# Patient Record
Sex: Female | Born: 1980 | Race: Black or African American | Hispanic: No | Marital: Single | Smoking: Former smoker
Health system: Southern US, Community
[De-identification: ages and names within clinical notes are randomized; demographics above are authoritative.]

## PROBLEM LIST (undated history)

## (undated) DIAGNOSIS — T753XXA Motion sickness, initial encounter: Secondary | ICD-10-CM

## (undated) DIAGNOSIS — T8859XA Other complications of anesthesia, initial encounter: Secondary | ICD-10-CM

## (undated) DIAGNOSIS — Z973 Presence of spectacles and contact lenses: Secondary | ICD-10-CM

## (undated) DIAGNOSIS — D649 Anemia, unspecified: Secondary | ICD-10-CM

## (undated) DIAGNOSIS — R05 Cough: Secondary | ICD-10-CM

## (undated) DIAGNOSIS — T4145XA Adverse effect of unspecified anesthetic, initial encounter: Secondary | ICD-10-CM

## (undated) DIAGNOSIS — R569 Unspecified convulsions: Secondary | ICD-10-CM

## (undated) DIAGNOSIS — K219 Gastro-esophageal reflux disease without esophagitis: Secondary | ICD-10-CM

## (undated) HISTORY — PX: UTERINE FIBROID EMBOLIZATION: SHX825

## (undated) HISTORY — PX: DILATION AND CURETTAGE OF UTERUS: SHX78

## (undated) HISTORY — PX: NO PAST SURGERIES: SHX2092

---
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
## 2004-12-02 ENCOUNTER — Emergency Department: Payer: Self-pay | Admitting: Unknown Physician Specialty

## 2005-04-28 ENCOUNTER — Emergency Department: Payer: Self-pay | Admitting: General Practice

## 2007-02-22 ENCOUNTER — Emergency Department: Payer: Self-pay | Admitting: Emergency Medicine

## 2008-01-15 ENCOUNTER — Emergency Department: Payer: Self-pay | Admitting: Emergency Medicine

## 2008-03-22 ENCOUNTER — Emergency Department: Payer: Self-pay | Admitting: Emergency Medicine

## 2009-01-21 ENCOUNTER — Emergency Department: Payer: Self-pay | Admitting: Emergency Medicine

## 2009-07-18 ENCOUNTER — Emergency Department: Payer: Self-pay

## 2010-02-10 ENCOUNTER — Emergency Department: Payer: Self-pay | Admitting: Emergency Medicine

## 2014-01-28 ENCOUNTER — Inpatient Hospital Stay: Payer: Self-pay | Admitting: Obstetrics and Gynecology

## 2014-01-28 LAB — BASIC METABOLIC PANEL
ANION GAP: 7 (ref 7–16)
BUN: 9 mg/dL (ref 7–18)
CHLORIDE: 104 mmol/L (ref 98–107)
CO2: 26 mmol/L (ref 21–32)
Calcium, Total: 8.6 mg/dL (ref 8.5–10.1)
Creatinine: 1.01 mg/dL (ref 0.60–1.30)
EGFR (African American): >60
Glucose: 128 mg/dL — ABNORMAL HIGH (ref 65–99)
Osmolality: 274 (ref 275–301)
Potassium: 3.5 mmol/L (ref 3.5–5.1)
Sodium: 137 mmol/L (ref 136–145)

## 2014-01-28 LAB — CBC
HCT: 23.4 % — ABNORMAL LOW (ref 35.0–47.0)
HGB: 7.4 g/dL — AB (ref 12.0–16.0)
MCH: 26.7 pg (ref 26.0–34.0)
MCHC: 31.5 g/dL — AB (ref 32.0–36.0)
MCV: 85 fL (ref 80–100)
Platelet: 301 10*3/uL (ref 150–440)
RBC: 2.76 10*6/uL — AB (ref 3.80–5.20)
RDW: 13.7 % (ref 11.5–14.5)
WBC: 12.8 10*3/uL — AB (ref 3.6–11.0)

## 2014-01-28 LAB — HCG, QUANTITATIVE, PREGNANCY: Beta Hcg, Quant.: <1 m[IU]/mL — ABNORMAL LOW

## 2014-01-29 LAB — HEMOGLOBIN
HGB: 8.1 g/dL — AB (ref 12.0–16.0)
HGB: 9.2 g/dL — AB (ref 12.0–16.0)
HGB: 9.1 g/dL — AB (ref 12.0–16.0)

## 2014-01-30 LAB — HEMOGLOBIN
HGB: 8.2 g/dL — ABNORMAL LOW (ref 12.0–16.0)
HGB: 7.5 g/dL — ABNORMAL LOW (ref 12.0–16.0)

## 2014-01-30 LAB — GC/CHLAMYDIA PROBE AMP

## 2014-01-31 LAB — HEMOGLOBIN
HGB: 6.9 g/dL — ABNORMAL LOW (ref 12.0–16.0)
HGB: 6.7 g/dL — ABNORMAL LOW (ref 12.0–16.0)
HGB: 8.2 g/dL — AB (ref 12.0–16.0)

## 2014-02-02 LAB — PATHOLOGY REPORT

## 2015-02-08 NOTE — Op Note (Signed)
PATIENT NAME:  Kim Craig, [name] MR#:  [id number] DATE OF BIRTH:  Dec 15, 1980  DATE OF PROCEDURE:  01/30/2014  DATE OF DICTATION: 01/31/2014    PREOPERATIVE DIAGNOSES:  1. Menorrhagia.  2. Symptomatic anemia.   POSTOPERATIVE DIAGNOSES:  1. Menorrhagia.  2. Symptomatic anemia.   OPERATION: Dilation and curettage.   ANESTHESIA: General endotracheal.   SURGEON: Keeleigh Terris S. Valentino Saxonherry, MD  ESTIMATED BLOOD LOSS: 600 mL.   COMPLICATIONS: None.   FINDINGS: Included an enlarged boggy uterus, approximately 10-week size. Uterus was sounded to 10 cm. There were numerous large blood clots evacuated from the uterine cavity.   SPECIMEN: Endometrial curettings.   Indication: Patient is a 34 y.o P1-0-0-1 female with symptomatic anemia requiring blood transfusion, menorrhagia x several weeks.  Patient was admitted with a Hgbof 7.4, but was symptomatic with weakness, fatigue, and tachycardia at presentation.  Was started on Provera 20 mg and transfused 2 units PRBCs with inappropriate rise to 8.2.  Given an additional unit which increased Hgb to 9.2, however patient began to experience heavy vaginal bleeding again, withHgb decreasing back down to 8.4 in less than 24 hrs.  Decision was made to proceed with surgical management with Dilation and Curettage.   PROCEDURE: The patient was taken to the operating room, where she was placed under general anesthesia without difficulty. She was then placed in the dorsal lithotomy position and prepped and draped in the normal sterile fashion. Next, a straight catheterization was performed, with a return of 20 mL of clear urine.  After this, a weighted speculum was inserted into the patient's vagina. The cervix was visualized, and a single-tooth tenaculum was used to grasp the anterior lip of the cervix. The uterus was sounded to 10 cm. The cervix was already noted to be dilated. A endometrial curette was then passed into the uterine cavity, and the cavity was curettaged until a  gritty texture was noted. Numerous blood clots were evacuated from the uterus. Endometrial samplings were collected and sent to pathology. After the curettage, the tenaculum was removed from the anterior lip of the cervix. The weighted speculum was then removed from the patient's vagina. The patient tolerated the procedure well. She was then taken to the PACU in stable condition.    ____________________________ Jacques EarthlyAnika S. Valentino Saxonherry, MD asc:lb D: 01/31/2014 08:37:00 ET T: 01/31/2014 08:48:58 ET JOB#: [id number]  cc: Jacques EarthlyAnika S. Valentino Saxonherry, MD, <Dictator> Fabian NovemberANIKA S Randa Riss MD ELECTRONICALLY SIGNED 02/03/2014 17:45

## 2015-03-09 ENCOUNTER — Ambulatory Visit
Admission: EM | Admit: 2015-03-09 | Discharge: 2015-03-09 | Disposition: A | Discharge status: Home / Self Care | Payer: No Typology Code available for payment source | Attending: Family Medicine | Admitting: Family Medicine

## 2015-03-09 DIAGNOSIS — J029 Acute pharyngitis, unspecified: Secondary | ICD-10-CM | POA: Insufficient documentation

## 2015-03-09 LAB — RAPID STREP SCREEN (MED CTR MEBANE ONLY): Streptococcus, Group A Screen (Direct): NEGATIVE

## 2015-03-09 MED ORDER — AMOXICILLIN-POT CLAVULANATE 875-125 MG PO TABS: 1.0000 | ORAL_TABLET | Freq: Two times a day (BID) | ORAL | Status: AC

## 2015-03-09 NOTE — ED Provider Notes (Addendum)
SUBJECTIVE:  Kim Craig is a 34 y.o. female who complains of sore throat, fatigue, fever for the last 2 days. Denies CP, SOB, N/V/D, abdominal pain, severe headache. Has tried OTC Medication without much relief.    OBJECTIVE: She appears well, vital signs are as noted.  General: NAD HEENT: moderate pharyngeal erythema, no exudate, no erythema of TMs, mild cervical LAD Respiratory: CTA B Cardiology: tachycardic Abdomen: obese, +BS, NT/ND, no guarding or rebound Neurological: CN II-XII grossly intact   ASSESSMENT:  Pharyngitis  PLAN: Negative rapid strep.Given symptoms will treat with Augmentin, Ibuprofen prn, rest,  hydration, seek medical attention if symptoms persist or worsen as discussed. Work excuse for 1 day given to patient.       Jolene ProvostKirtida Tu Bayle, MD 03/09/15 1514  Jolene ProvostKirtida Tavaughn Silguero, MD 03/09/15 [id number]

## 2015-03-09 NOTE — ED Notes (Signed)
Started last night with c/o very sore throat. + chills. + fatigue

## 2015-03-11 LAB — CULTURE, GROUP A STREP (THRC)

## 2016-09-10 ENCOUNTER — Encounter: Payer: Self-pay | Admitting: Emergency Medicine

## 2016-09-10 ENCOUNTER — Emergency Department
Admission: EM | Admit: 2016-09-10 | Discharge: 2016-09-11 | Disposition: A | Discharge status: Home / Self Care | Payer: Managed Care, Other (non HMO) | Attending: Emergency Medicine | Admitting: Emergency Medicine

## 2016-09-10 DIAGNOSIS — Z3A01 Less than 8 weeks gestation of pregnancy: Secondary | ICD-10-CM | POA: Diagnosis not present

## 2016-09-10 DIAGNOSIS — O26891 Other specified pregnancy related conditions, first trimester: Secondary | ICD-10-CM | POA: Diagnosis not present

## 2016-09-10 DIAGNOSIS — O219 Vomiting of pregnancy, unspecified: Secondary | ICD-10-CM | POA: Diagnosis present

## 2016-09-10 DIAGNOSIS — R079 Chest pain, unspecified: Secondary | ICD-10-CM | POA: Insufficient documentation

## 2016-09-10 DIAGNOSIS — Z87891 Personal history of nicotine dependence: Secondary | ICD-10-CM | POA: Insufficient documentation

## 2016-09-10 LAB — COMPREHENSIVE METABOLIC PANEL
ALK PHOS: 61 U/L (ref 38–126)
ALT: 11 U/L — AB (ref 14–54)
ANION GAP: 7 (ref 5–15)
AST: 13 U/L — ABNORMAL LOW (ref 15–41)
Albumin: 3.7 g/dL (ref 3.5–5.0)
BILIRUBIN TOTAL: 0.1 mg/dL — AB (ref 0.3–1.2)
BUN: 10 mg/dL (ref 6–20)
CALCIUM: 9.8 mg/dL (ref 8.9–10.3)
CO2: 23 mmol/L (ref 22–32)
CREATININE: 0.73 mg/dL (ref 0.44–1.00)
Chloride: 104 mmol/L (ref 101–111)
GFR calc non Af Amer: >60 mL/min (ref >60)
GLUCOSE: 94 mg/dL (ref 65–99)
Potassium: 4.1 mmol/L (ref 3.5–5.1)
Sodium: 134 mmol/L — ABNORMAL LOW (ref 135–145)
TOTAL PROTEIN: 8 g/dL (ref 6.5–8.1)

## 2016-09-10 LAB — URINALYSIS COMPLETE WITH MICROSCOPIC (ARMC ONLY)
Bilirubin Urine: NEGATIVE
GLUCOSE, UA: NEGATIVE mg/dL
Hgb urine dipstick: NEGATIVE
KETONES UR: NEGATIVE mg/dL
NITRITE: NEGATIVE
PH: 7 (ref 5.0–8.0)
PROTEIN: NEGATIVE mg/dL
Specific Gravity, Urine: 1.014 (ref 1.005–1.030)

## 2016-09-10 LAB — HCG, QUANTITATIVE, PREGNANCY: HCG, BETA CHAIN, QUANT, S: 55022 m[IU]/mL — AB (ref <5)

## 2016-09-10 LAB — CBC
HCT: 29 % — ABNORMAL LOW (ref 35.0–47.0)
HEMOGLOBIN: 9.2 g/dL — AB (ref 12.0–16.0)
MCH: 19.8 pg — AB (ref 26.0–34.0)
MCHC: 31.7 g/dL — AB (ref 32.0–36.0)
MCV: 62.4 fL — ABNORMAL LOW (ref 80.0–100.0)
PLATELETS: 446 10*3/uL — AB (ref 150–440)
RBC: 4.64 MIL/uL (ref 3.80–5.20)
RDW: 20.7 % — ABNORMAL HIGH (ref 11.5–14.5)
WBC: 9.2 10*3/uL (ref 3.6–11.0)

## 2016-09-10 LAB — LIPASE, BLOOD: Lipase: 17 U/L (ref 11–51)

## 2016-09-10 MED ORDER — PYRIDOXINE HCL 25 MG PO TABS: 50.0000 mg | ORAL_TABLET | Freq: Three times a day (TID) | ORAL | 0 refills | Status: DC

## 2016-09-10 MED ORDER — SUCRALFATE 1 G PO TABS
1.0000 g | ORAL_TABLET | Freq: Once | ORAL | Status: AC
  Administered 2016-09-10: 1 g via ORAL
  Filled 2016-09-10: qty 1

## 2016-09-10 MED ORDER — METOCLOPRAMIDE HCL 5 MG/ML IJ SOLN
10.0000 mg | Freq: Once | INTRAMUSCULAR | Status: AC
  Administered 2016-09-10: 10 mg via INTRAVENOUS
  Filled 2016-09-10: qty 2

## 2016-09-10 MED ORDER — ONDANSETRON HCL 4 MG/2ML IJ SOLN
4.0000 mg | Freq: Once | INTRAMUSCULAR | Status: AC
  Administered 2016-09-10: 4 mg via INTRAVENOUS
  Filled 2016-09-10: qty 2

## 2016-09-10 MED ORDER — NITROFURANTOIN MONOHYD MACRO 100 MG PO CAPS: 100.0000 mg | ORAL_CAPSULE | Freq: Two times a day (BID) | ORAL | 0 refills | Status: AC

## 2016-09-10 MED ORDER — SODIUM CHLORIDE 0.9 % IV BOLUS (SEPSIS)
1000.0000 mL | Freq: Once | INTRAVENOUS | Status: AC
  Administered 2016-09-10: 1000 mL via INTRAVENOUS

## 2016-09-10 MED ORDER — ONDANSETRON HCL 4 MG PO TABS: 4.0000 mg | ORAL_TABLET | Freq: Three times a day (TID) | ORAL | 0 refills | Status: DC | PRN

## 2016-09-10 NOTE — Discharge Instructions (Signed)
As we discussed please start taking prenatal vitamins. Please seek medical attention for any high fevers, chest pain, shortness of breath, change in behavior, persistent vomiting, bloody stool or any other new or concerning symptoms.  

## 2016-09-10 NOTE — ED Triage Notes (Signed)
Pt states that she was told by her general practitioner that she was pregnant on Tuesday and for about a week she has been nauseated and vomiting. Pt is in NAD at this time.

## 2016-09-10 NOTE — ED Notes (Signed)
Patient given apple juice PO challenge, she is able to keep it down without nausea feeling worse.

## 2016-09-10 NOTE — ED Provider Notes (Signed)
[facility] Emergency Department Provider Note    ____________________________________________   I have reviewed the triage vital signs and the nursing notes.   HISTORY  Chief Complaint Nausea   History limited by: Not Limited   HPI Kim Craig is a 35 y.o. female who presents to the emergency department today because of concerns for nausea in the setting of an early pregnancy. Patient states that she was diagnosed with being pregnant this week when she was at her primary care doctor's office. They did prescribe Reglan. Patient has had continued nausea and vomiting. She states she is not able to hold anything down.   History reviewed. No pertinent past medical history.  There are no active problems to display for this patient.   History reviewed. No pertinent surgical history.  Prior to Admission medications   Not on File    Allergies Patient has no known allergies.  Family History  Problem Relation Age of Onset  . Diabetes Mother     Social History Social History  Substance Use Topics  . Smoking status: Former Games developermoker  . Smokeless tobacco: Never Used  . Alcohol use Yes     Comment: socially    Review of Systems  Constitutional: Negative for fever. Cardiovascular: Negative for chest pain. Respiratory: Negative for shortness of breath. Gastrointestinal: Positive for nausea and vomiting.  Genitourinary: Negative for dysuria. Musculoskeletal: Negative for back pain. Skin: Negative for rash. Neurological: Negative for headaches, focal weakness or numbness.   10-point ROS otherwise negative.  ____________________________________________   PHYSICAL EXAM:  VITAL SIGNS: ED Triage Vitals  Enc Vitals Group     BP 09/10/16 2119 105/89     Pulse Rate 09/10/16 2119 84     Resp 09/10/16 2119 20     Temp 09/10/16 2119 98.2 F (36.8 C)     Temp Source 09/10/16 2119 Oral     SpO2 09/10/16 2119 100 %     Weight 09/10/16 2120 209 lb  (94.8 kg)     Height 09/10/16 2120 5\' 4"  (1.626 m)     Head Circumference --      Peak Flow --      Pain Score 09/10/16 2120 0   Constitutional: Alert and oriented. Well appearing and in no distress. Eyes: Conjunctivae are normal. Normal extraocular movements. ENT   Head: Normocephalic and atraumatic.   Nose: No congestion/rhinnorhea.   Mouth/Throat: Mucous membranes are moist.   Neck: No stridor. Hematological/Lymphatic/Immunilogical: No cervical lymphadenopathy. Cardiovascular: Normal rate, regular rhythm.  No murmurs, rubs, or gallops.  Respiratory: Normal respiratory effort without tachypnea nor retractions. Breath sounds are clear and equal bilaterally. No wheezes/rales/rhonchi. Gastrointestinal: Soft and nontender. No distention.  Genitourinary: Deferred Musculoskeletal: Normal range of motion in all extremities. No lower extremity edema. Neurologic:  Normal speech and language. No gross focal neurologic deficits are appreciated.  Skin:  Skin is warm, dry and intact. No rash noted. Psychiatric: Mood and affect are normal. Speech and behavior are normal. Patient exhibits appropriate insight and judgment.  ____________________________________________    LABS (pertinent positives/negatives)  Labs Reviewed  COMPREHENSIVE METABOLIC PANEL - Abnormal; Notable for the following:       Result Value   Sodium 134 (*)    AST 13 (*)    ALT 11 (*)    Total Bilirubin 0.1 (*)    All other components within normal limits  CBC - Abnormal; Notable for the following:    Hemoglobin 9.2 (*)    HCT 29.0 (*)  MCV 62.4 (*)    MCH 19.8 (*)    MCHC 31.7 (*)    RDW 20.7 (*)    Platelets 446 (*)    All other components within normal limits  URINALYSIS COMPLETEWITH MICROSCOPIC ([facility] ONLY) - Abnormal; Notable for the following:    Color, Urine YELLOW (*)    APPearance CLOUDY (*)    Leukocytes, UA 3+ (*)    Bacteria, UA RARE (*)    Squamous Epithelial / LPF 6-30 (*)    All  other components within normal limits  HCG, QUANTITATIVE, PREGNANCY - Abnormal; Notable for the following:    hCG, Beta Chain, Quant, S 55,022 (*)    All other components within normal limits  LIPASE, BLOOD     ____________________________________________   EKG  None  ____________________________________________    RADIOLOGY  None  ____________________________________________   PROCEDURES  Procedures  ____________________________________________   INITIAL IMPRESSION / ASSESSMENT AND PLAN / ED COURSE  Pertinent labs & imaging results that were available during my care of the patient were reviewed by me and considered in my medical decision making (see chart for details).  Patient emergency department today with nausea and vomiting related to early pregnancy. Blood work without any concerning electrolyte abnormalities. Urinalysis does show some white blood cells however some squamous cells as well. However given early pregnancy will give prescription for Macrobid. Patient stated she felt better after fluids and Zofran. Was able to keep down a little bit of fluid. ____________________________________________   FINAL CLINICAL IMPRESSION(S) / ED DIAGNOSES  Final diagnoses:  Nausea and vomiting during pregnancy     Note: This dictation was prepared with Dragon dictation. Any transcriptional errors that result from this process are unintentional    Phineas SemenGraydon Shanin Szymanowski, MD 09/10/16 2324

## 2017-11-08 DIAGNOSIS — R059 Cough, unspecified: Secondary | ICD-10-CM

## 2017-11-08 HISTORY — DX: Cough, unspecified: R05.9

## 2017-11-09 ENCOUNTER — Encounter: Payer: Self-pay | Admitting: *Deleted

## 2017-11-09 ENCOUNTER — Other Ambulatory Visit: Payer: Self-pay

## 2017-11-15 ENCOUNTER — Ambulatory Visit: Payer: 59 | Admitting: Student in an Organized Health Care Education/Training Program

## 2017-11-15 ENCOUNTER — Ambulatory Visit
Admission: RE | Admit: 2017-11-15 | Discharge: 2017-11-15 | Disposition: A | Discharge status: Home / Self Care | Payer: 59 | Source: Ambulatory Visit | Attending: Orthopedic Surgery | Admitting: Orthopedic Surgery

## 2017-11-15 ENCOUNTER — Encounter
Admission: RE | Disposition: A | Discharge status: Home / Self Care | Payer: Self-pay | Source: Ambulatory Visit | Attending: Orthopedic Surgery

## 2017-11-15 DIAGNOSIS — Z6836 Body mass index (BMI) 36.0-36.9, adult: Secondary | ICD-10-CM | POA: Insufficient documentation

## 2017-11-15 DIAGNOSIS — M19012 Primary osteoarthritis, left shoulder: Secondary | ICD-10-CM | POA: Insufficient documentation

## 2017-11-15 HISTORY — PX: SHOULDER ARTHROSCOPY: SHX128

## 2017-11-15 HISTORY — DX: Cough: R05

## 2017-11-15 HISTORY — DX: Anemia, unspecified: D64.9

## 2017-11-15 HISTORY — DX: Unspecified convulsions: R56.9

## 2017-11-15 HISTORY — DX: Motion sickness, initial encounter: T75.3XXA

## 2017-11-15 HISTORY — DX: Presence of spectacles and contact lenses: Z97.3

## 2017-11-15 SURGERY — ARTHROSCOPY, SHOULDER
Anesthesia: General | Site: Shoulder | Laterality: Left | Wound class: Clean

## 2017-11-15 MED ORDER — ONDANSETRON HCL 4 MG/2ML IJ SOLN
INTRAMUSCULAR | Status: DC | PRN
  Administered 2017-11-15: 4 mg via INTRAVENOUS

## 2017-11-15 MED ORDER — ACETAMINOPHEN 325 MG PO TABS: 325.0000 mg | ORAL_TABLET | ORAL | Status: DC | PRN

## 2017-11-15 MED ORDER — OXYCODONE HCL 5 MG/5ML PO SOLN: 5.0000 mg | Freq: Once | ORAL | Status: DC | PRN

## 2017-11-15 MED ORDER — SCOPOLAMINE 1 MG/3DAYS TD PT72
1.0000 | MEDICATED_PATCH | Freq: Once | TRANSDERMAL | Status: DC
  Administered 2017-11-15: 1.5 mg via TRANSDERMAL

## 2017-11-15 MED ORDER — OXYCODONE HCL 5 MG PO TABS: 5.0000 mg | ORAL_TABLET | Freq: Once | ORAL | Status: DC | PRN

## 2017-11-15 MED ORDER — IBUPROFEN 800 MG PO TABS: 800.0000 mg | ORAL_TABLET | Freq: Three times a day (TID) | ORAL | 0 refills | Status: AC

## 2017-11-15 MED ORDER — OXYCODONE HCL 5 MG PO TABS: 5.0000 mg | ORAL_TABLET | ORAL | 0 refills | Status: AC | PRN

## 2017-11-15 MED ORDER — ACETAMINOPHEN 500 MG PO TABS: 1000.0000 mg | ORAL_TABLET | Freq: Three times a day (TID) | ORAL | 2 refills | Status: AC

## 2017-11-15 MED ORDER — LACTATED RINGERS IV SOLN
INTRAVENOUS | Status: DC
  Administered 2017-11-15 (×2): via INTRAVENOUS

## 2017-11-15 MED ORDER — LIDOCAINE-EPINEPHRINE 1 %-1:100000 IJ SOLN
INTRAMUSCULAR | Status: DC | PRN
  Administered 2017-11-15: 5 mL

## 2017-11-15 MED ORDER — LIDOCAINE HCL (CARDIAC) 20 MG/ML IV SOLN
INTRAVENOUS | Status: DC | PRN
  Administered 2017-11-15: 40 mg via INTRAVENOUS

## 2017-11-15 MED ORDER — FENTANYL CITRATE (PF) 100 MCG/2ML IJ SOLN
INTRAMUSCULAR | Status: DC | PRN
  Administered 2017-11-15: 100 ug via INTRAVENOUS

## 2017-11-15 MED ORDER — ACETAMINOPHEN 160 MG/5ML PO SOLN: 325.0000 mg | ORAL | Status: DC | PRN

## 2017-11-15 MED ORDER — FENTANYL CITRATE (PF) 100 MCG/2ML IJ SOLN: 25.0000 ug | INTRAMUSCULAR | Status: DC | PRN

## 2017-11-15 MED ORDER — ONDANSETRON 4 MG PO TBDP: 4.0000 mg | ORAL_TABLET | Freq: Three times a day (TID) | ORAL | 0 refills | Status: DC | PRN

## 2017-11-15 MED ORDER — ROPIVACAINE HCL 5 MG/ML IJ SOLN
INTRAMUSCULAR | Status: DC | PRN
  Administered 2017-11-15: 35 mL via PERINEURAL

## 2017-11-15 MED ORDER — ONDANSETRON HCL 4 MG/2ML IJ SOLN: 4.0000 mg | Freq: Once | INTRAMUSCULAR | Status: DC | PRN

## 2017-11-15 MED ORDER — DEXAMETHASONE SODIUM PHOSPHATE 4 MG/ML IJ SOLN
INTRAMUSCULAR | Status: DC | PRN
  Administered 2017-11-15: 4 mg via PERINEURAL

## 2017-11-15 MED ORDER — PROPOFOL 500 MG/50ML IV EMUL
INTRAVENOUS | Status: DC | PRN
  Administered 2017-11-15: 75 ug/kg/min via INTRAVENOUS

## 2017-11-15 MED ORDER — MIDAZOLAM HCL 2 MG/2ML IJ SOLN
INTRAMUSCULAR | Status: DC | PRN
  Administered 2017-11-15 (×2): 1 mg via INTRAVENOUS
  Administered 2017-11-15: 2 mg via INTRAVENOUS

## 2017-11-15 MED ORDER — DEXTROSE 5 % IV SOLN
2000.0000 mg | Freq: Once | INTRAVENOUS | Status: AC
  Administered 2017-11-15: 2000 mg via INTRAVENOUS

## 2017-11-15 SURGICAL SUPPLY — 42 items
BLADE MED AGGRESSIVE (BLADE) ×3 IMPLANT
BLADE OSCILLATING/SAGITTAL (BLADE) ×2
BLADE SW THK.38XMED LNG THN (BLADE) ×1 IMPLANT
CANISTER SUCT 1200ML W/VALVE (MISCELLANEOUS) ×3 IMPLANT
CHLORAPREP W/TINT 26ML (MISCELLANEOUS) ×3 IMPLANT
CLOSURE WOUND 1/2 X4 (GAUZE/BANDAGES/DRESSINGS) ×1
COVER LIGHT HANDLE UNIVERSAL (MISCELLANEOUS) ×6 IMPLANT
DECANTER SPIKE VIAL GLASS SM (MISCELLANEOUS) ×3 IMPLANT
DRAPE IMP U-DRAPE 54X76 (DRAPES) ×6 IMPLANT
DRAPE INCISE IOBAN 66X45 STRL (DRAPES) ×3 IMPLANT
DRAPE SHEET LG 3/4 BI-LAMINATE (DRAPES) ×3 IMPLANT
DRSG TEGADERM 4X4.75 (GAUZE/BANDAGES/DRESSINGS) ×3 IMPLANT
ELECT REM PT RETURN 9FT ADLT (ELECTROSURGICAL) ×3
ELECTRODE REM PT RTRN 9FT ADLT (ELECTROSURGICAL) ×1 IMPLANT
GAUZE PETRO XEROFOAM 1X8 (MISCELLANEOUS) ×3 IMPLANT
GAUZE SPONGE 4X4 12PLY STRL (GAUZE/BANDAGES/DRESSINGS) ×3 IMPLANT
GLOVE BIOGEL PI IND STRL 8 (GLOVE) ×1 IMPLANT
GLOVE BIOGEL PI INDICATOR 8 (GLOVE) ×2
GOWN STRL REUS W/ TWL LRG LVL3 (GOWN DISPOSABLE) ×1 IMPLANT
GOWN STRL REUS W/TWL LRG LVL3 (GOWN DISPOSABLE) ×2
GOWN STRL REUS W/TWL LRG LVL4 (GOWN DISPOSABLE) ×3 IMPLANT
KIT RM TURNOVER STRD PROC AR (KITS) ×3 IMPLANT
KIT STABILIZATION SHOULDER (MISCELLANEOUS) ×3 IMPLANT
MASK FACE SPIDER DISP (MASK) ×3 IMPLANT
NEEDLE HYPO 21X1.5 SAFETY (NEEDLE) ×3 IMPLANT
NS IRRIG 500ML POUR BTL (IV SOLUTION) ×6 IMPLANT
PACK ARTHROSCOPY SHOULDER (MISCELLANEOUS) ×3 IMPLANT
PAD ABD DERMACEA PRESS 5X9 (GAUZE/BANDAGES/DRESSINGS) ×3 IMPLANT
PAD WRAPON POLAR SHDR UNIV (MISCELLANEOUS) ×1 IMPLANT
SLING ARM LRG DEEP (SOFTGOODS) ×3 IMPLANT
SLING ULTRA II M (MISCELLANEOUS) ×3 IMPLANT
STAPLER SKIN PROX 35W (STAPLE) ×3 IMPLANT
STRAP BODY AND KNEE 60X3 (MISCELLANEOUS) ×3 IMPLANT
STRIP CLOSURE SKIN 1/2X4 (GAUZE/BANDAGES/DRESSINGS) ×2 IMPLANT
SUT MNCRL 4-0 (SUTURE) ×2
SUT MNCRL 4-0 27XMFL (SUTURE) ×1
SUT VIC AB 0 CT1 36 (SUTURE) ×3 IMPLANT
SUT VIC AB 3-0 SH 27 (SUTURE) ×2
SUT VIC AB 3-0 SH 27X BRD (SUTURE) ×1 IMPLANT
SUT VICRYL 0 AB UR-6 (SUTURE) ×3 IMPLANT
SUTURE MNCRL 4-0 27XMF (SUTURE) ×1 IMPLANT
WRAPON POLAR PAD SHDR UNIV (MISCELLANEOUS) ×3

## 2017-11-15 NOTE — Anesthesia Procedure Notes (Signed)
Performed by: Ethel Veronica, CRNA Pre-anesthesia Checklist: Patient identified, Emergency Drugs available, Suction available, Patient being monitored and Timeout performed Patient Re-evaluated:Patient Re-evaluated prior to induction Oxygen Delivery Method: Simple face mask Placement Confirmation: positive ETCO2 and breath sounds checked- equal and bilateral       

## 2017-11-15 NOTE — Anesthesia Procedure Notes (Signed)
Anesthesia Regional Block: Interscalene brachial plexus block   Pre-Anesthetic Checklist: ,, timeout performed, Correct Patient, Correct Site, Correct Laterality, Correct Procedure, Correct Position, site marked, Risks and benefits discussed,  Surgical consent,  Pre-op evaluation,  At surgeon's request and post-op pain management  Laterality: Left  Prep: chloraprep       Needles:  Injection technique: Single-shot  Needle Type: Stimiplex     Needle Length: 10cm  Needle Gauge: 21     Additional Needles:   Narrative:  Start time: 11/15/2017 12:28 PM End time: 11/15/2017 12:34 PM Injection made incrementally with aspirations every 5 mL.  Performed by: Personally  Anesthesiologist: Ranee GosselinStella, Dariana Garbett, MD  Additional Notes: Functioning IV was confirmed and monitors applied. Ultrasound guidance: relevant anatomy identified, needle position confirmed, local anesthetic spread visualized around nerve(s)., vascular puncture avoided.  Image printed for medical record.  Negative aspiration and no paresthesias; incremental administration of local anesthetic. The patient tolerated the procedure well. Vitals signes recorded in RN notes.

## 2017-11-15 NOTE — Op Note (Signed)
Operative Note   SURGERY DATE: 11/15/2017  PRE-OP DIAGNOSIS:  1. L acromioclavicular joint osteoarthritis  POST-OP DIAGNOSIS: 1. L acromioclavicular joint osteoarthritis  PROCEDURES:  1. L open distal clavicle excision  SURGEON: Rosealee AlbeeSunny H. Halah Whiteside, MD  ASSISTANTS: none  ANESTHESIA: Gen   ESTIMATED BLOOD LOSS:20cc  TOTAL IV FLUIDS: per anesthesia  DRAIN: none  INDICATION(S):  Kim Craig is a 37 y.o. female with L shoulder pain. Clinical examination and imaging was consistent with [facility] joint arthritis. Diagnostic lidocaine [facility] into [facility] provided >95% relief of pain.   OPERATIVE FINDINGS: significant acromioclavicular joint arthritic changes  OPERATIVE REPORT:  I identified Kim Craig the pre-operative holding area. Informed consent was obtained and the surgical site was marked. I reviewed the risks and benefits of the proposed surgical intervention and the patient (and/or patient's guardian) wished to proceed. The patient was transferred to the operative suite and general anesthesia was administered. The patient was placed in the beach chair position with the head of the bed elevated approximately 70 degrees. All down side pressure points were appropriately padded. The extremity was then prepped and draped in standard fashion. A time out was performed confirming the correct extremity, correct patient, and correct procedure.   The acromioclavicular joint was identified with a spinal needle. An ~5cm incision parallel to the skin lines was made, centered on the [facility] joint. Dissection was carried down to deltotrapezial fascia. This was incised sharply and dissection was carried down to the [facility] joint capsule. This was incised in line with the fibers from ~125mm lateral to the [facility] joint to ~4115mm medial to the [facility] joint. The capsule was elevated anteriorly and posteriorly in a subperiosteal fashion. A mark was made ~5710mm distal to and parallel to the [facility] joint. A needle was  placed back into the joint to estimate trajectory, which was relatively flat. A sagittal saw was used to make a cut ~410mm distal and parallel to the [facility] joint parallel to the direction of the joint as well. Distal clavicle was removed and hemostasis was achieved. I placed my finger into the space created and took the arm through a range of motion. There was no significant impingement. The wound was thoroughly irrigated. The capsule was then repaired meticulously with 0-Vicryl suture. The deltotrapezial fascia was also repaired with 0-Vicryl. The dermal layer was repaired with 3-0 Vicryl, and the skin with a 4-0 Monocryl in a running fashion. The wound was then covered with steri-strips, xeroform, and a sterile dressing.  A PolarCare and sling were placed. The patient was then transferred to a stretcher bed and to the post antesthesia care unit in stable condition.   POSTOPERATIVE PLAN: The patient will start PT on POD#3-4. She will remain in a sling for 4 weeks total. She can start pendulums and elbow/wrist/finger RoM immediately. Begin passive and active assisted shoulder RoM after 2 weeks. Can begin active shoulder RoM at 4 weeks. Follow up with me in ~2 weeks.

## 2017-11-15 NOTE — H&P (Signed)
Paper H&P to be scanned into permanent record. H&P reviewed. No significant changes noted.  

## 2017-11-15 NOTE — Anesthesia Preprocedure Evaluation (Signed)
Anesthesia Evaluation  Patient identified by MRN, date of birth, ID band Patient awake    Reviewed: Allergy & Precautions, H&P , NPO status , Patient's Chart, lab work & pertinent test results  Airway Mallampati: II  TM Distance: >3 FB Neck ROM: full    Dental no notable dental hx.    Pulmonary    Pulmonary exam normal breath sounds clear to auscultation       Cardiovascular Normal cardiovascular exam Rhythm:regular Rate:Normal     Neuro/Psych    GI/Hepatic   Endo/Other  Morbid obesity  Renal/GU      Musculoskeletal   Abdominal   Peds  Hematology   Anesthesia Other Findings   Reproductive/Obstetrics                             Anesthesia Physical Anesthesia Plan  ASA: II  Anesthesia Plan: General   Post-op Pain Management:  Regional for Post-op pain   Induction: Intravenous  PONV Risk Score and Plan: 3 and Ondansetron, Dexamethasone, Propofol infusion and Treatment may vary due to age or medical condition  Airway Management Planned: Natural Airway  Additional Equipment:   Intra-op Plan:   Post-operative Plan:   Informed Consent: I have reviewed the patients History and Physical, chart, labs and discussed the procedure including the risks, benefits and alternatives for the proposed anesthesia with the patient or authorized representative who has indicated his/her understanding and acceptance.     Plan Discussed with: CRNA  Anesthesia Plan Comments:         Anesthesia Quick Evaluation

## 2017-11-15 NOTE — Transfer of Care (Signed)
Immediate Anesthesia Transfer of Care Note  Patient: Kim Craig  Procedure(s) Performed: OPEN DISTAL CLAVICLE EXCISION (Left Shoulder)  Patient Location: PACU  Anesthesia Type: General  Level of Consciousness: awake, alert  and patient cooperative  Airway and Oxygen Therapy: Patient Spontanous Breathing and Patient connected to supplemental oxygen  Post-op Assessment: Post-op Vital signs reviewed, Patient's Cardiovascular Status Stable, Respiratory Function Stable, Patent Airway and No signs of Nausea or vomiting  Post-op Vital Signs: Reviewed and stable  Complications: No apparent anesthesia complications

## 2017-11-15 NOTE — Discharge Instructions (Addendum)
Post-Op Instructions - Shoulder Surgery  1. Bracing: You will wear a shoulder immobilizer or sling for 4 weeks.   2. Driving:  Ideally, we recommend no driving for 4 weeks while sling is in place as one arm will be immobilized.   3. Activity: No active lifting for 4-6 weeks. Wrist, hand, and elbow motion only with pendulum exercises for 1st 2 weeks after surgery. Weeks 2-4 can begin passive and active assist RoM exercises with physical therapist. Can have active motion after 4 weeks.   4. Physical Therapy: Begins 3-4 days after surgery, and proceed 1-2 times/week  5. Medications:  - You will be provided a prescription for narcotic pain medicine. After surgery, take 1-2 narcotic tablets every 4 hours if needed for severe pain.  - A prescription for anti-nausea medication will be provided in case the narcotic medicine causes nausea - take 1 tablet every 6 hours only if nauseated.   - Take tylenol 1000 mg (2 Extra Strength tablets or 3 regular strength) every 8 hours for pain.  May decrease or stop tylenol 5 days after surgery if you are having minimal pain. - Take ibuprofen (Advil/Motrin) 800mg  three times/day with food. Stop taking if you experience any GI irritation.    If you are taking prescription medication for anxiety, depression, insomnia, muscle spasm, chronic pain, or for attention deficit disorder, you are advised that you are at a higher risk of adverse effects with use of narcotics post-op, including narcotic addiction/dependence, depressed breathing, death. If you use non-prescribed substances: alcohol, marijuana, cocaine, heroin, methamphetamines, etc., you are at a higher risk of adverse effects with use of narcotics post-op, including narcotic addiction/dependence, depressed breathing, death. You are advised that taking > 50 morphine milligram equivalents (MME) of narcotic pain medication per day results in twice the risk of overdose or death. For your prescription provided:  oxycodone 5 mg - taking more than 6 tablets per day would result in > 50 morphine milligram equivalents (MME) of narcotic pain medication. Be advised that we will prescribe narcotics short-term, for acute post-operative pain only - 3 weeks for major operations such as shoulder repair/reconstruction surgeries.    6. Post-Op Appointment:  Your first post-op appointment will be 10-14 days post-op.  7. Work or School: For most, but not all procedures, we advise staying out of work or school for at least 1 to 2 weeks in order to recover from the stress of surgery and to allow time for healing.   If you need a work or school note this can be provided.        General Anesthesia, Adult, Care After These instructions provide you with information about caring for yourself after your procedure. Your health care provider may also give you more specific instructions. Your treatment has been planned according to current medical practices, but problems sometimes occur. Call your health care provider if you have any problems or questions after your procedure. What can I expect after the procedure? After the procedure, it is common to have:  Vomiting.  A sore throat.  Mental slowness.  It is common to feel:  Nauseous.  Cold or shivery.  Sleepy.  Tired.  Sore or achy, even in parts of your body where you did not have surgery.  Follow these instructions at home: For at least 24 hours after the procedure:  Do not: ? Participate in activities where you could fall or become injured. ? Drive. ? Use heavy machinery. ? Drink alcohol. ? Take sleeping pills  or medicines that cause drowsiness. ? Make important decisions or sign legal documents. ? Take care of children on your own.  Rest. Eating and drinking  If you vomit, drink water, juice, or soup when you can drink without vomiting.  Drink enough fluid to keep your urine clear or pale yellow.  Make sure you have little or no nausea  before eating solid foods.  Follow the diet recommended by your health care provider. General instructions  Have a responsible adult stay with you until you are awake and alert.  Return to your normal activities as told by your health care provider. Ask your health care provider what activities are safe for you.  Take over-the-counter and prescription medicines only as told by your health care provider.  If you smoke, do not smoke without supervision.  Keep all follow-up visits as told by your health care provider. This is important. Contact a health care provider if:  You continue to have nausea or vomiting at home, and medicines are not helpful.  You cannot drink fluids or start eating again.  You cannot urinate after 8-12 hours.  You develop a skin rash.  You have fever.  You have increasing redness at the site of your procedure. Get help right away if:  You have difficulty breathing.  You have chest pain.  You have unexpected bleeding.  You feel that you are having a life-threatening or urgent problem. This information is not intended to replace advice given to you by your health care provider. Make sure you discuss any questions you have with your health care provider. Document Released: 01/10/2001 Document Revised: 03/08/2016 Document Reviewed: 09/18/2015 Elsevier Interactive Patient Education  [employer]2018 [employer]

## 2017-11-15 NOTE — Progress Notes (Signed)
Assisted Mike Stella ANMD with left, ultrasound guided, interscalene block. Side rails up, monitors on throughout procedure. See vital signs in flow sheet. Tolerated Procedure well.   

## 2017-11-15 NOTE — Anesthesia Postprocedure Evaluation (Signed)
Anesthesia Post Note  Patient: Kim Craig  Procedure(s) Performed: OPEN DISTAL CLAVICLE EXCISION (Left Shoulder)  Patient location during evaluation: PACU Anesthesia Type: General Level of consciousness: awake and alert and oriented Pain management: satisfactory to patient Vital Signs Assessment: post-procedure vital signs reviewed and stable Respiratory status: spontaneous breathing, nonlabored ventilation and respiratory function stable Cardiovascular status: blood pressure returned to baseline and stable Postop Assessment: Adequate PO intake and No signs of nausea or vomiting Anesthetic complications: no    Cherly BeachStella, Ginni Eichler J

## 2017-11-16 ENCOUNTER — Encounter: Payer: Self-pay | Admitting: Orthopedic Surgery

## 2017-11-17 LAB — SURGICAL PATHOLOGY

## 2018-05-23 ENCOUNTER — Encounter: Payer: Self-pay | Admitting: Emergency Medicine

## 2018-05-23 ENCOUNTER — Other Ambulatory Visit: Payer: Self-pay

## 2018-05-23 ENCOUNTER — Emergency Department
Admission: EM | Admit: 2018-05-23 | Discharge: 2018-05-23 | Disposition: A | Discharge status: Home / Self Care | Payer: Managed Care, Other (non HMO) | Attending: Emergency Medicine | Admitting: Emergency Medicine

## 2018-05-23 DIAGNOSIS — N939 Abnormal uterine and vaginal bleeding, unspecified: Secondary | ICD-10-CM | POA: Diagnosis present

## 2018-05-23 DIAGNOSIS — Z79899 Other long term (current) drug therapy: Secondary | ICD-10-CM | POA: Diagnosis not present

## 2018-05-23 LAB — COMPREHENSIVE METABOLIC PANEL
ALBUMIN: 3.3 g/dL — AB (ref 3.5–5.0)
ALT: 13 U/L (ref 0–44)
ANION GAP: 6 (ref 5–15)
AST: 19 U/L (ref 15–41)
Alkaline Phosphatase: 67 U/L (ref 38–126)
BUN: 11 mg/dL (ref 6–20)
CHLORIDE: 107 mmol/L (ref 98–111)
CO2: 25 mmol/L (ref 22–32)
CREATININE: 0.98 mg/dL (ref 0.44–1.00)
Calcium: 9 mg/dL (ref 8.9–10.3)
GFR calc Af Amer: >60 mL/min (ref >60)
GFR calc non Af Amer: >60 mL/min (ref >60)
GLUCOSE: 120 mg/dL — AB (ref 70–99)
POTASSIUM: 3.8 mmol/L (ref 3.5–5.1)
SODIUM: 138 mmol/L (ref 135–145)
Total Bilirubin: 0.4 mg/dL (ref 0.3–1.2)
Total Protein: 6.8 g/dL (ref 6.5–8.1)

## 2018-05-23 LAB — CBC
HEMATOCRIT: 30.4 % — AB (ref 35.0–47.0)
Hemoglobin: 10.1 g/dL — ABNORMAL LOW (ref 12.0–16.0)
MCH: 28 pg (ref 26.0–34.0)
MCHC: 33.4 g/dL (ref 32.0–36.0)
MCV: 83.8 fL (ref 80.0–100.0)
PLATELETS: 289 10*3/uL (ref 150–440)
RBC: 3.62 MIL/uL — ABNORMAL LOW (ref 3.80–5.20)
RDW: 14.4 % (ref 11.5–14.5)
WBC: 6.6 10*3/uL (ref 3.6–11.0)

## 2018-05-23 NOTE — ED Notes (Signed)
Type and screen sent to the lab

## 2018-05-23 NOTE — ED Provider Notes (Signed)
[facility] Emergency Department Provider Note   ____________________________________________    I have reviewed the triage vital signs and the nursing notes.   HISTORY  Chief Complaint Vaginal Bleeding and Dizziness     HPI Kim Craig is a 37 y.o. female who presents with complaints of heavy vaginal bleeding.  Patient reports this is her first menstrual cycle since having a hysteroscopic myomectomy performed 1 month ago.  Prior to that she had been having daily vaginal bleeding, has been on multiple medications for this and has worked with her gynecologist.  Once in the past she is even required transfusion.  After procedure 1 month ago she has had nearly a month of no vaginal bleeding however she has had heavy vaginal bleeding with clots that started 4 days ago.  This is made her concerned that she had lost a much blood and came to the emergency department  Past Medical History:  Diagnosis Date  . Anemia   . Cough 11/08/2017  . Motion sickness    cars, boats, planes  . Seizures ([place])    x1 - over 15 yrs ago, after miscarriage  . Wears contact lenses     There are no active problems to display for this patient.   Past Surgical History:  Procedure Laterality Date  . NO PAST SURGERIES    . SHOULDER ARTHROSCOPY Left 11/15/2017   Procedure: OPEN DISTAL CLAVICLE EXCISION;  Surgeon: Leim Fabry, MD;  Location: [place];  Service: Orthopedics;  Laterality: Left;  MAC/ REG WITH INTERSCALENE BLOCK BEACH CHAIR WITH SPYDER SIMPLY SLING  . UTERINE FIBROID EMBOLIZATION      Prior to Admission medications   Medication Sig Start Date End Date Taking? Authorizing Provider  etonogestrel (NEXPLANON) 68 MG IMPL implant 1 each by Subdermal route once.   Yes [provider]  acetaminophen (TYLENOL) 500 MG tablet Take 2 tablets (1,000 mg total) by mouth every 8 (eight) hours. 11/15/17 11/15/18  Leim Fabry, MD  ipratropium (ATROVENT HFA)  17 MCG/ACT inhaler Inhale 2 puffs into the lungs 3 (three) times daily as needed for wheezing.    [provider]  ondansetron (ZOFRAN ODT) 4 MG disintegrating tablet Take 1 tablet (4 mg total) by mouth every 8 (eight) hours as needed for nausea or vomiting. Patient not taking: Reported on 05/23/2018 11/15/17   Leim Fabry, MD  oxyCODONE (ROXICODONE) 5 MG immediate release tablet Take 1-2 tablets (5-10 mg total) by mouth every 4 (four) hours as needed (pain). Patient not taking: Reported on 05/23/2018 11/15/17 11/15/18  Leim Fabry, MD     Allergies Metronidazole  Family History  Problem Relation Age of Onset  . Diabetes Mother     Social History Social History   Tobacco Use  . Smoking status: Never Smoker  . Smokeless tobacco: Never Used  Substance Use Topics  . Alcohol use: Yes    Alcohol/week: 1.2 oz    Types: 2 Glasses of wine per week    Comment: socially  . Drug use: No    Review of Systems  Constitutional: Occasional dizziness Eyes: No visual changes.  ENT: No sore throat. Cardiovascular: Denies chest pain. Respiratory: Denies shortness of breath. Gastrointestinal: Mild cramping Genitourinary: As above Musculoskeletal: Negative for back pain. Skin: Negative for rash. Neurological: Negative for headaches   ____________________________________________   PHYSICAL EXAM:  VITAL SIGNS: ED Triage Vitals  Enc Vitals Group     BP 05/23/18 1116 131/82     Pulse Rate  05/23/18 1116 91     Resp 05/23/18 1116 20     Temp 05/23/18 1116 97.7 F (36.5 C)     Temp Source 05/23/18 1116 Oral     SpO2 05/23/18 1116 100 %     Weight 05/23/18 1117 104.3 kg (230 lb)     Height 05/23/18 1117 1.626 m (_0 )     Head Circumference --      Peak Flow --      Pain Score 05/23/18 1119 6     Pain Loc --      Pain Edu? --      Excl. in [place]? --     Constitutional: Alert and oriented. No acute distress. Pleasant and interactive Eyes: Conjunctivae are normal.   Nose: No  congestion/rhinnorhea. Mouth/Throat: Mucous membranes are moist.    Cardiovascular: Normal rate, regular rhythm.Good peripheral circulation. Respiratory: Normal respiratory effort.  No retractions.   Genitourinary: deferred at patient request musculoskeletal:Warm and well perfused Neurologic:  Normal speech and language. No gross focal neurologic deficits are appreciated.  Skin:  Skin is warm, dry and intact. No rash noted. Psychiatric: Mood and affect are normal. Speech and behavior are normal.  ____________________________________________   LABS (all labs ordered are listed, but only abnormal results are displayed)  Labs Reviewed  COMPREHENSIVE METABOLIC PANEL - Abnormal; Notable for the following components:      Result Value   Glucose, Bld 120 (*)    Albumin 3.3 (*)    All other components within normal limits  CBC - Abnormal; Notable for the following components:   RBC 3.62 (*)    Hemoglobin 10.1 (*)    HCT 30.4 (*)    All other components within normal limits  POC URINE PREG, ED   ____________________________________________  EKG  None ____________________________________________  RADIOLOGY  None ____________________________________________   PROCEDURES  Procedure(s) performed: No  Procedures   Critical Care performed: No ____________________________________________   INITIAL IMPRESSION / ASSESSMENT AND PLAN / ED COURSE  Pertinent labs & imaging results that were available during my care of the patient were reviewed by me and considered in my medical decision making (see chart for details).  Patient presents with complaints of vaginal bleeding.  Vital signs are unremarkable.  She is well-appearing and sitting up in bed.  Hemoglobin is quite reassuring.  No indication for transfusion or further work-up in the emergency department, will notify patient's gynecology    ____________________________________________   FINAL CLINICAL IMPRESSION(S) / ED  DIAGNOSES  Final diagnoses:  None        Note:  This document was prepared using Dragon voice recognition software and may include unintentional dictation errors.    Lavonia Drafts, MD 05/23/18 1255

## 2018-05-23 NOTE — ED Triage Notes (Signed)
Says she has excessive vaginal bleeding.  Had some fibroids removed last month and this is first period since then.  Says history of anemia as well.

## 2018-05-23 NOTE — ED Notes (Signed)
.   Pt is resting, Respirations even and unlabored, NAD. Stretcher lowest postion and locked. Call bell within reach. Denies any needs at this time RN will continue to monitor.    

## 2018-05-29 ENCOUNTER — Emergency Department
Admission: EM | Admit: 2018-05-29 | Discharge: 2018-05-29 | Disposition: A | Discharge status: Home / Self Care | Payer: Managed Care, Other (non HMO) | Attending: Emergency Medicine | Admitting: Emergency Medicine

## 2018-05-29 ENCOUNTER — Other Ambulatory Visit: Payer: Self-pay

## 2018-05-29 ENCOUNTER — Encounter: Payer: Self-pay | Admitting: Emergency Medicine

## 2018-05-29 ENCOUNTER — Emergency Department: Payer: Managed Care, Other (non HMO)

## 2018-05-29 DIAGNOSIS — D649 Anemia, unspecified: Secondary | ICD-10-CM

## 2018-05-29 DIAGNOSIS — R0602 Shortness of breath: Secondary | ICD-10-CM

## 2018-05-29 DIAGNOSIS — Z79899 Other long term (current) drug therapy: Secondary | ICD-10-CM | POA: Insufficient documentation

## 2018-05-29 LAB — COMPREHENSIVE METABOLIC PANEL
ALK PHOS: 65 U/L (ref 38–126)
ALT: 19 U/L (ref 0–44)
ANION GAP: 5 (ref 5–15)
AST: 16 U/L (ref 15–41)
Albumin: 3.3 g/dL — ABNORMAL LOW (ref 3.5–5.0)
BUN: 12 mg/dL (ref 6–20)
CALCIUM: 8.7 mg/dL — AB (ref 8.9–10.3)
CO2: 25 mmol/L (ref 22–32)
Chloride: 110 mmol/L (ref 98–111)
Creatinine, Ser: 0.76 mg/dL (ref 0.44–1.00)
GFR calc non Af Amer: >60 mL/min (ref >60)
Glucose, Bld: 98 mg/dL (ref 70–99)
POTASSIUM: 4 mmol/L (ref 3.5–5.1)
SODIUM: 140 mmol/L (ref 135–145)
Total Bilirubin: 0.4 mg/dL (ref 0.3–1.2)
Total Protein: 6.8 g/dL (ref 6.5–8.1)

## 2018-05-29 LAB — CBC
HCT: 29.2 % — ABNORMAL LOW (ref 35.0–47.0)
Hemoglobin: 9.8 g/dL — ABNORMAL LOW (ref 12.0–16.0)
MCH: 28.2 pg (ref 26.0–34.0)
MCHC: 33.6 g/dL (ref 32.0–36.0)
MCV: 83.9 fL (ref 80.0–100.0)
PLATELETS: 352 10*3/uL (ref 150–440)
RBC: 3.48 MIL/uL — ABNORMAL LOW (ref 3.80–5.20)
RDW: 14.4 % (ref 11.5–14.5)
WBC: 7.6 10*3/uL (ref 3.6–11.0)

## 2018-05-29 LAB — BRAIN NATRIURETIC PEPTIDE: B NATRIURETIC PEPTIDE 5: 11 pg/mL (ref 0.0–100.0)

## 2018-05-29 LAB — TROPONIN I: Troponin I: <0.03 ng/mL (ref <0.03)

## 2018-05-29 MED ORDER — FERROUS SULFATE 325 (65 FE) MG PO TABS: 325.0000 mg | ORAL_TABLET | Freq: Every day | ORAL | 3 refills | Status: DC

## 2018-05-29 NOTE — ED Provider Notes (Signed)
[facility] Emergency Department Provider Note   ____________________________________________    I have reviewed the triage vital signs and the nursing notes.   HISTORY  Chief Complaint Shortness of breath    HPI Kim Craig is a 37 y.o. female who presents with complaints of shortness of breath.  Patient reports when she walks up her stairs she feels quite winded with some mild chest tightness.  She states that she knows that she has gained some weight but this is unusual for her.  Seen by me recently for heavy vaginal bleeding.  She reports that resolved and she has not had any further vaginal bleeding.  Denies fevers or chills.  No recent travel.  No calf pain or swelling.  No nausea or vomiting.  Feels well while resting.  No pleurisy.   Past Medical History:  Diagnosis Date  . Anemia   . Cough 11/08/2017  . Motion sickness    cars, boats, planes  . Seizures ([place])    x1 - over 15 yrs ago, after miscarriage  . Wears contact lenses     There are no active problems to display for this patient.   Past Surgical History:  Procedure Laterality Date  . NO PAST SURGERIES    . SHOULDER ARTHROSCOPY Left 11/15/2017   Procedure: OPEN DISTAL CLAVICLE EXCISION;  Surgeon: Leim Fabry, MD;  Location: [place];  Service: Orthopedics;  Laterality: Left;  MAC/ REG WITH INTERSCALENE BLOCK BEACH CHAIR WITH SPYDER SIMPLY SLING  . UTERINE FIBROID EMBOLIZATION      Prior to Admission medications   Medication Sig Start Date End Date Taking? Authorizing Provider  acetaminophen (TYLENOL) 500 MG tablet Take 2 tablets (1,000 mg total) by mouth every 8 (eight) hours. 11/15/17 11/15/18 Yes Leim Fabry, MD  etonogestrel (NEXPLANON) 68 MG IMPL implant 1 each by Subdermal route once.   Yes [provider]  ipratropium (ATROVENT HFA) 17 MCG/ACT inhaler Inhale 2 puffs into the lungs 3 (three) times daily as needed for wheezing.   Yes [provider]  ferrous sulfate 325 (65 FE) MG tablet Take 1 tablet (325 mg total) by mouth daily. 05/29/18 05/29/19  Lavonia Drafts, MD  ondansetron (ZOFRAN ODT) 4 MG disintegrating tablet Take 1 tablet (4 mg total) by mouth every 8 (eight) hours as needed for nausea or vomiting. Patient not taking: Reported on 05/23/2018 11/15/17   Leim Fabry, MD  oxyCODONE (ROXICODONE) 5 MG immediate release tablet Take 1-2 tablets (5-10 mg total) by mouth every 4 (four) hours as needed (pain). Patient not taking: Reported on 05/23/2018 11/15/17 11/15/18  Leim Fabry, MD     Allergies Metronidazole  Family History  Problem Relation Age of Onset  . Diabetes Mother     Social History Social History   Tobacco Use  . Smoking status: Never Smoker  . Smokeless tobacco: Never Used  Substance Use Topics  . Alcohol use: Yes    Alcohol/week: 2.0 standard drinks    Types: 2 Glasses of wine per week    Comment: socially  . Drug use: No    Review of Systems  Constitutional: No fever/chills Eyes: No visual changes.  ENT: No sore throat. Cardiovascular: As above Respiratory: As above Gastrointestinal: No abdominal pain.  No nausea, no vomiting.   Genitourinary: Negative for dysuria. Musculoskeletal: Negative for back pain. Skin: Negative for rash. Neurological: Negative for headaches   ____________________________________________   PHYSICAL EXAM:  VITAL SIGNS: ED Triage Vitals  Enc Vitals  Group     BP 05/29/18 1001 118/66     Pulse Rate 05/29/18 1001 71     Resp 05/29/18 1001 20     Temp 05/29/18 1001 97.9 F (36.6 C)     Temp Source 05/29/18 1001 Oral     SpO2 05/29/18 1001 99 %     Weight 05/29/18 1002 104.3 kg (230 lb)     Height 05/29/18 1002 1.626 m (_0 )     Head Circumference --      Peak Flow --      Pain Score 05/29/18 1012 4     Pain Loc --      Pain Edu? --      Excl. in [place]? --     Constitutional: Alert and oriented. No acute distress. Pleasant and interactive Eyes:  Conjunctivae are normal.   Nose: No congestion/rhinnorhea. Mouth/Throat: Mucous membranes are moist.    Cardiovascular: Normal rate, regular rhythm. Grossly normal heart sounds.  Good peripheral circulation. Respiratory: Normal respiratory effort.  No retractions. Lungs CTAB. Gastrointestinal: Soft and nontender. No distention.  No CVA tenderness. Genitourinary: deferred Musculoskeletal: No lower extremity tenderness nor edema.  Warm and well perfused Neurologic:  Normal speech and language. No gross focal neurologic deficits are appreciated.  Skin:  Skin is warm, dry and intact. No rash noted. Psychiatric: Mood and affect are normal. Speech and behavior are normal.  ____________________________________________   LABS (all labs ordered are listed, but only abnormal results are displayed)  Labs Reviewed  CBC - Abnormal; Notable for the following components:      Result Value   RBC 3.48 (*)    Hemoglobin 9.8 (*)    HCT 29.2 (*)    All other components within normal limits  COMPREHENSIVE METABOLIC PANEL - Abnormal; Notable for the following components:   Calcium 8.7 (*)    Albumin 3.3 (*)    All other components within normal limits  TROPONIN I  BRAIN NATRIURETIC PEPTIDE   ____________________________________________  EKG  ED ECG REPORT I, Lavonia Drafts, the attending physician, personally viewed and interpreted this ECG.  Date: 05/29/2018  Rhythm: normal sinus rhythm QRS Axis: normal Intervals: normal ST/T Wave abnormalities: normal Narrative Interpretation: no evidence of acute ischemia  ____________________________________________  RADIOLOGY  X-ray normal ____________________________________________   PROCEDURES  Procedure(s) performed: No  Procedures   Critical Care performed: No ____________________________________________   INITIAL IMPRESSION / ASSESSMENT AND PLAN / ED COURSE  Pertinent labs & imaging results that were available during my care  of the patient were reviewed by me and considered in my medical decision making (see chart for details).  Patient well-appearing in no acute distress, no wheezing on exam, lungs are clear to auscultation bilaterally.  Vitals are normal currently.  Denies pleurisy, no tachycardia.  Patient does admit that symptoms have been ongoing for many months and gradually getting worse, I wonder if this may be related to iron deficiency anemia  Work appears essentially unremarkable.  I will start her on iron supplementation have her follow-up closely with PCP for further work-up    ____________________________________________   FINAL CLINICAL IMPRESSION(S) / ED DIAGNOSES  Final diagnoses:  Shortness of breath  Anemia, unspecified type        Note:  This document was prepared using Dragon voice recognition software and may include unintentional dictation errors.     Lavonia Drafts, MD 05/29/18 [phone]

## 2018-05-29 NOTE — ED Notes (Signed)
First Nurse Note:  Hx of cough and HA X 3 days.  Denies any other symptoms.  Alert and oriented, NAD.

## 2018-05-29 NOTE — ED Triage Notes (Signed)
Pt states shob and headache with some dizziness for the past few days now, not relieved with advil.  Very short of breath when walking up steps. Appears in no distress at this time, talking in full sentences.

## 2019-04-02 ENCOUNTER — Ambulatory Visit: Admit: 2019-04-02 | Payer: Managed Care, Other (non HMO) | Admitting: Obstetrics and Gynecology

## 2019-04-02 SURGERY — HYSTERECTOMY, VAGINAL, LAPAROSCOPY-ASSISTED, WITH SALPINGO-OOPHORECTOMY
Anesthesia: General | Laterality: Left

## 2019-04-04 NOTE — H&P (Signed)
[name] is a 38 y.o. female here  For LAVH and LSO for menorrhagia and solid hyperechoic mass left ovary  . . . Pt was seen last year and had a endometrial mass. She underwent a Myosure resection by another provider in July 2019 and since bleeding has not improved . She bleeds 10-12 days per month and has to wear Pampers to control bleeding . She has failed Mirenax2 , lysteda and nexplanon  . She has pressure dyspareunia. Pap last year and all otheres normal . She is s/p 1 SVD . She desires definitive surgery . Of note she has a 1.7 cm solid component left ovary that was seen last yr .  U/s 12/2018:  Fibroid post to endometrium= 55 mm  Endometrium=6.53 mm  Rt ov wnl  Lt ov Solid mass seen sup avg= 1.79 cm 1.66 x 2.04 x 1.67 cm    Past Medical History:  has a past medical history of Abnormal uterine bleeding, Anxiety (08/13/2014), Encounter for blood transfusion (2015), GERD (gastroesophageal reflux disease), History of motion sickness, Morbid obesity (CMS-HCC), Plantar fasciitis (02/14/2012), and PONV (postoperative nausea and vomiting), unspecified.  Past Surgical History:  has a past surgical history that includes ; Combined hysteroscopy diagnostic / D&C (2015); Arthroscopy Shoulder; removal non-biodegradable drug delivery implant (Left, 04/18/2018); and Hysteroscopy with myosure (04/2018). Family History: family history includes Breast cancer in an other family member; Colon cancer in her maternal uncle; Diabetes type II in her mother; High blood pressure (Hypertension) in her mother; Hip fracture in her maternal grandmother; Obesity in her maternal grandmother and mother; Osteoporosis (Thinning of bones) in her maternal grandmother; Thyroid disease in her maternal grandmother. Social History:  reports that she has never smoked. She has never used smokeless tobacco. She reports that she does not drink alcohol or use drugs. OB/GYN History:          OB History    Gravida  5   Para  1   Term      Preterm      AB  4   Living  1     SAB  1   TAB  3   Ectopic      Molar      Multiple      Live Births  1          Allergies: is allergic to metronidazole. Medications:  Current Outpatient Medications:  .  acyclovir (ZOVIRAX) 5 % ointment, Apply 1 Application topically 5 (five) times daily, Disp: 15 g, Rfl: 0 .  valACYclovir (VALTREX) 500 MG tablet, Take 1 tablet (500 mg total) by mouth once daily, Disp: 30 tablet, Rfl: 11 .  metroNIDAZOLE (METROGEL) 0.75 % vaginal gel, Place 1 applicator vaginally nightly, Disp: 70 g, Rfl: 0  Review of Systems: General:                      No fatigue or weight loss Eyes:                           No vision changes Ears:                            No hearing difficulty Respiratory:                No cough or shortness of breath Pulmonary:  No asthma or shortness of breath Cardiovascular:           No chest pain, palpitations, dyspnea on exertion Gastrointestinal:          No abdominal bloating, chronic diarrhea, constipations, masses, pain or hematochezia Genitourinary:             No hematuria, dysuria, abnormal vaginal discharge, pelvic pain, +Menometrorrhagia Lymphatic:                   No swollen lymph nodes Musculoskeletal:         No muscle weakness Neurologic:                  No extremity weakness, syncope, seizure disorder Psychiatric:                  No history of depression, delusions or suicidal/homicidal ideation    Exam:      Vitals:   04/05/19 1051  BP: 108/65  Pulse: 64    Body mass index is 39.65 kg/m.  WDWN / black female in NAD   Lungs: CTA  CV : RRR without murmur    Neck:  no thyromegaly Abdomen: soft , no mass, normal active bowel sounds,  non-tender, no rebound tenderness Pelvic: tanner stage 5 ,  External genitalia: vulva /labia no lesions Urethra: no prolapse Vagina: wet mount : + bv , adequate room for LAVH if need be  Cervix: no  lesions, no cervical motion tenderness   Uterus: normal size shape and contour, non-tender Adnexa: no mass,  non-tender   Rectovaginal:       Impression:   The primary encounter diagnosis was Leukorrhea. Diagnoses of Ovarian cyst, left and Menorrhagia with irregular cycle were also pertinent to this visit.    Plan:  I have spoken with the patient regarding treatment options including expectant management, hormonal options, or surgical intervention. After a full discussion the pt elects to proceed with LAVH and LSO and right salpingectomy  Solid component of left ovary has slightly enlarged      THOMAS Samuel Germany, MD

## 2019-04-16 ENCOUNTER — Other Ambulatory Visit: Payer: Self-pay

## 2019-04-16 ENCOUNTER — Encounter
Admission: RE | Admit: 2019-04-16 | Discharge: 2019-04-16 | Disposition: A | Discharge status: Home / Self Care | Payer: Managed Care, Other (non HMO) | Source: Ambulatory Visit | Attending: Obstetrics and Gynecology | Admitting: Obstetrics and Gynecology

## 2019-04-16 DIAGNOSIS — Z01812 Encounter for preprocedural laboratory examination: Secondary | ICD-10-CM | POA: Insufficient documentation

## 2019-04-16 DIAGNOSIS — Z1159 Encounter for screening for other viral diseases: Secondary | ICD-10-CM | POA: Insufficient documentation

## 2019-04-16 HISTORY — DX: Other complications of anesthesia, initial encounter: T88.59XA

## 2019-04-16 HISTORY — DX: Gastro-esophageal reflux disease without esophagitis: K21.9

## 2019-04-16 NOTE — Patient Instructions (Signed)
Your procedure is scheduled on: 04-23-19 MONDAY Report to Same Day Surgery 2nd floor medical mall [facility] Entrance-take elevator on left to 2nd floor.  Check in with surgery information desk.) To find out your arrival time please call [phone] between 1PM - 3PM on 04-20-19 FRIDAY  Remember: Instructions that are not followed completely may result in serious medical risk, up to and including death, or upon the discretion of your surgeon and anesthesiologist your surgery may need to be rescheduled.    _x___ 1. Do not eat food after midnight the night before your procedure. NO GUM OR CANDY AFTER MIDNIGHT. You may drink clear liquids up to 2 hours before you are scheduled to arrive at the hospital for your procedure.  Do not drink clear liquids within 2 hours of your scheduled arrival to the hospital.  Clear liquids include  --Water or Apple juice without pulp  --Clear carbohydrate beverage such as ClearFast or Gatorade  --Black Coffee or Clear Tea (No milk, no creamers, do not add anything to the coffee or Tea   ____Ensure clear carbohydrate drink on the way to the hospital for bariatric patients  _X___Ensure clear carbohydrate drink 3 hours before surgery      __x__ 2. No Alcohol for 24 hours before or after surgery.   __x__3. No Smoking or e-cigarettes for 24 prior to surgery.  Do not use any chewable tobacco products for at least 6 hour prior to surgery   ____  4. Bring all medications with you on the day of surgery if instructed.    __x__ 5. Notify your doctor if there is any change in your medical condition     (cold, fever, infections).    x___6. On the morning of surgery brush your teeth with toothpaste and water.  You may rinse your mouth with mouth wash if you wish.  Do not swallow any toothpaste or mouthwash.   Do not wear jewelry, make-up, hairpins, clips or nail polish.  Do not wear lotions, powders, or perfumes. You may wear deodorant.  Do not shave 48 hours prior  to surgery. Men may shave face and neck.  Do not bring valuables to the hospital.    [facility] is not responsible for any belongings or valuables.               Contacts, dentures or bridgework may not be worn into surgery.  Leave your suitcase in the car. After surgery it may be brought to your room.  For patients admitted to the hospital, discharge time is determined by your treatment team.  _  Patients discharged the day of surgery will not be allowed to drive home.  You will need someone to drive you home and stay with you the night of your procedure.    Please read over the following fact sheets that you were given:   [facility] Preparing for Surgery/[employer]  ____ Take anti-hypertensive listed below, cardiac, seizure, asthma,     anti-reflux and psychiatric medicines. These include:  1. NONE  2.  3.  4.  5.  6.  ____Fleets enema or Magnesium Citrate as directed.   _x___ Use CHG Soap or sage wipes as directed on instruction sheet   ____ Use inhalers on the day of surgery and bring to hospital day of surgery  ____ Stop Metformin and Janumet 2 days prior to surgery.    ____ Take 1/2 of usual insulin dose the night before surgery and none on the morning  surgery.   ____ Follow recommendations from Cardiologist, Pulmonologist or PCP regarding stopping Aspirin, Coumadin, Plavix ,Eliquis, Effient, or Pradaxa, and Pletal.  X____Stop Anti-inflammatories such as Advil, Aleve, Ibuprofen, Motrin, Naproxen, Naprosyn, Goodies powders or aspirin products NOW- OK to take Tylenol   ____ Stop supplements until after surgery.     ____ Bring C-Pap to the hospital.

## 2019-04-19 ENCOUNTER — Other Ambulatory Visit
Admission: RE | Admit: 2019-04-19 | Discharge: 2019-04-19 | Disposition: A | Discharge status: Home / Self Care | Payer: Managed Care, Other (non HMO) | Source: Ambulatory Visit | Attending: Obstetrics and Gynecology | Admitting: Obstetrics and Gynecology

## 2019-04-19 ENCOUNTER — Other Ambulatory Visit: Payer: Self-pay

## 2019-04-19 DIAGNOSIS — Z01812 Encounter for preprocedural laboratory examination: Secondary | ICD-10-CM | POA: Diagnosis not present

## 2019-04-19 LAB — CBC
HCT: 22.7 % — ABNORMAL LOW (ref 36.0–46.0)
Hemoglobin: 6 g/dL — ABNORMAL LOW (ref 12.0–15.0)
MCH: 16.5 pg — ABNORMAL LOW (ref 26.0–34.0)
MCHC: 26.4 g/dL — ABNORMAL LOW (ref 30.0–36.0)
MCV: 62.4 fL — ABNORMAL LOW (ref 80.0–100.0)
Platelets: 332 10*3/uL (ref 150–400)
RBC: 3.64 MIL/uL — ABNORMAL LOW (ref 3.87–5.11)
RDW: 21.2 % — ABNORMAL HIGH (ref 11.5–15.5)
WBC: 6.8 10*3/uL (ref 4.0–10.5)
nRBC: 0 % (ref 0.0–0.2)

## 2019-04-19 LAB — SARS CORONAVIRUS 2 (TAT 6-24 HRS): SARS Coronavirus 2: NEGATIVE

## 2019-04-19 LAB — BASIC METABOLIC PANEL
Anion gap: 9 (ref 5–15)
BUN: 11 mg/dL (ref 6–20)
CO2: 20 mmol/L — ABNORMAL LOW (ref 22–32)
Calcium: 8.5 mg/dL — ABNORMAL LOW (ref 8.9–10.3)
Chloride: 110 mmol/L (ref 98–111)
Creatinine, Ser: 0.74 mg/dL (ref 0.44–1.00)
GFR calc Af Amer: >60 mL/min (ref >60)
GFR calc non Af Amer: >60 mL/min (ref >60)
Glucose, Bld: 85 mg/dL (ref 70–99)
Potassium: 3.6 mmol/L (ref 3.5–5.1)
Sodium: 139 mmol/L (ref 135–145)

## 2019-04-19 LAB — TYPE AND SCREEN
ABO/RH(D): A POS
Antibody Screen: NEGATIVE

## 2019-04-19 NOTE — Pre-Procedure Instructions (Signed)
Faxed hgb results and called Dr Towanda Octave office about low hgb.

## 2019-04-22 ENCOUNTER — Observation Stay
Admission: RE | Admit: 2019-04-22 | Discharge: 2019-04-23 | Disposition: A | Discharge status: Home / Self Care | Payer: 59 | Attending: Obstetrics and Gynecology | Admitting: Obstetrics and Gynecology

## 2019-04-22 ENCOUNTER — Other Ambulatory Visit: Payer: Self-pay

## 2019-04-22 DIAGNOSIS — D649 Anemia, unspecified: Secondary | ICD-10-CM | POA: Diagnosis present

## 2019-04-22 DIAGNOSIS — Z881 Allergy status to other antibiotic agents status: Secondary | ICD-10-CM | POA: Insufficient documentation

## 2019-04-22 DIAGNOSIS — N898 Other specified noninflammatory disorders of vagina: Secondary | ICD-10-CM | POA: Diagnosis not present

## 2019-04-22 DIAGNOSIS — Z1159 Encounter for screening for other viral diseases: Secondary | ICD-10-CM | POA: Insufficient documentation

## 2019-04-22 DIAGNOSIS — N83202 Unspecified ovarian cyst, left side: Secondary | ICD-10-CM | POA: Insufficient documentation

## 2019-04-22 DIAGNOSIS — Z6838 Body mass index (BMI) 38.0-38.9, adult: Secondary | ICD-10-CM | POA: Insufficient documentation

## 2019-04-22 DIAGNOSIS — N920 Excessive and frequent menstruation with regular cycle: Secondary | ICD-10-CM | POA: Diagnosis not present

## 2019-04-22 DIAGNOSIS — Z9889 Other specified postprocedural states: Secondary | ICD-10-CM

## 2019-04-22 LAB — PREPARE RBC (CROSSMATCH)

## 2019-04-22 LAB — HEMOGLOBIN AND HEMATOCRIT, BLOOD
HCT: 22.9 % — ABNORMAL LOW (ref 36.0–46.0)
HCT: 26.7 % — ABNORMAL LOW (ref 36.0–46.0)
Hemoglobin: 6.1 g/dL — ABNORMAL LOW (ref 12.0–15.0)
Hemoglobin: 7.6 g/dL — ABNORMAL LOW (ref 12.0–15.0)

## 2019-04-22 LAB — PREGNANCY, URINE: Preg Test, Ur: NEGATIVE

## 2019-04-22 MED ORDER — LACTATED RINGERS IV SOLN: INTRAVENOUS | Status: DC

## 2019-04-22 MED ORDER — ACETAMINOPHEN 500 MG PO TABS
1000.0000 mg | ORAL_TABLET | ORAL | Status: AC
  Administered 2019-04-23: 1000 mg via ORAL
  Filled 2019-04-22: qty 2

## 2019-04-22 MED ORDER — GABAPENTIN 300 MG PO CAPS
300.0000 mg | ORAL_CAPSULE | ORAL | Status: AC
  Administered 2019-04-23: 300 mg via ORAL
  Filled 2019-04-22: qty 1

## 2019-04-22 MED ORDER — ACETAMINOPHEN 325 MG PO TABS
650.0000 mg | ORAL_TABLET | Freq: Once | ORAL | Status: AC
  Administered 2019-04-22: 650 mg via ORAL
  Filled 2019-04-22: qty 2

## 2019-04-22 MED ORDER — FAMOTIDINE 20 MG PO TABS
20.0000 mg | ORAL_TABLET | Freq: Once | ORAL | Status: AC
  Administered 2019-04-22: 20 mg via ORAL
  Filled 2019-04-22: qty 1

## 2019-04-22 MED ORDER — PRENATAL MULTIVITAMIN CH
1.0000 | ORAL_TABLET | Freq: Every day | ORAL | Status: DC
  Administered 2019-04-23: 1 via ORAL
  Filled 2019-04-22: qty 1

## 2019-04-22 MED ORDER — LACTATED RINGERS IV SOLN
INTRAVENOUS | Status: DC
  Administered 2019-04-22: 22:00:00 via INTRAVENOUS

## 2019-04-22 MED ORDER — DIPHENHYDRAMINE HCL 25 MG PO CAPS
25.0000 mg | ORAL_CAPSULE | Freq: Once | ORAL | Status: AC
  Administered 2019-04-22: 25 mg via ORAL
  Filled 2019-04-22: qty 1

## 2019-04-22 MED ORDER — DEXTROSE 5 % IV SOLN
3.0000 g | Freq: Once | INTRAVENOUS | Status: AC
  Administered 2019-04-23: 3 g via INTRAVENOUS
  Filled 2019-04-22: qty 3

## 2019-04-22 MED ORDER — LACTATED RINGERS IV SOLN
INTRAVENOUS | Status: DC
  Administered 2019-04-23 (×3): via INTRAVENOUS

## 2019-04-22 MED ORDER — SODIUM CHLORIDE 0.9% IV SOLUTION
Freq: Once | INTRAVENOUS | Status: AC
  Administered 2019-04-22: 16:00:00 via INTRAVENOUS

## 2019-04-22 NOTE — Progress Notes (Signed)
Spoke with [facility] in regards to patient admission. Unclear as to where the patient was coming from and if we were to accept pt now. AC called patient placement and the pt is to be a direct admit today per Dr. Ouida Sills for surgery on 04/23/19. AC instructed to not accept pt until they ar here. Will pass on to oncoming shift

## 2019-04-22 NOTE — Progress Notes (Signed)
Pt admitted to room 347, orders released. Reviewed admission packet, oriented to mother-baby unit. Pt aware of upcoming procedure, will obtain consent and labs, begin transfusing 2 units PBRC's when type and screen is complete.  Pt mentioned she would like to speak with female provider at some point about her after care and plans for after surgery. Dr. Leonides Schanz notified by RN.

## 2019-04-23 ENCOUNTER — Observation Stay: Payer: 59 | Admitting: Certified Registered"

## 2019-04-23 ENCOUNTER — Encounter: Payer: Self-pay | Admitting: *Deleted

## 2019-04-23 ENCOUNTER — Encounter
Admission: RE | Disposition: A | Discharge status: Home / Self Care | Payer: Self-pay | Source: Home / Self Care | Attending: Obstetrics and Gynecology

## 2019-04-23 DIAGNOSIS — Z9889 Other specified postprocedural states: Secondary | ICD-10-CM

## 2019-04-23 DIAGNOSIS — N920 Excessive and frequent menstruation with regular cycle: Secondary | ICD-10-CM | POA: Diagnosis not present

## 2019-04-23 HISTORY — PX: LAPAROSCOPIC VAGINAL HYSTERECTOMY WITH SALPINGO OOPHORECTOMY: SHX6681

## 2019-04-23 LAB — BASIC METABOLIC PANEL
Anion gap: 7 (ref 5–15)
BUN: 10 mg/dL (ref 6–20)
CO2: 23 mmol/L (ref 22–32)
Calcium: 8.3 mg/dL — ABNORMAL LOW (ref 8.9–10.3)
Chloride: 108 mmol/L (ref 98–111)
Creatinine, Ser: 0.68 mg/dL (ref 0.44–1.00)
GFR calc Af Amer: >60 mL/min (ref >60)
GFR calc non Af Amer: >60 mL/min (ref >60)
Glucose, Bld: 133 mg/dL — ABNORMAL HIGH (ref 70–99)
Potassium: 4.2 mmol/L (ref 3.5–5.1)
Sodium: 138 mmol/L (ref 135–145)

## 2019-04-23 LAB — CBC
HCT: 30.8 % — ABNORMAL LOW (ref 36.0–46.0)
Hemoglobin: 9 g/dL — ABNORMAL LOW (ref 12.0–15.0)
MCH: 20.3 pg — ABNORMAL LOW (ref 26.0–34.0)
MCHC: 29.2 g/dL — ABNORMAL LOW (ref 30.0–36.0)
MCV: 69.5 fL — ABNORMAL LOW (ref 80.0–100.0)
Platelets: 326 10*3/uL (ref 150–400)
RBC: 4.43 MIL/uL (ref 3.87–5.11)
RDW: 26.5 % — ABNORMAL HIGH (ref 11.5–15.5)
WBC: 10.2 10*3/uL (ref 4.0–10.5)
nRBC: 0 % (ref 0.0–0.2)

## 2019-04-23 LAB — HEMOGLOBIN AND HEMATOCRIT, BLOOD
HCT: 29.8 % — ABNORMAL LOW (ref 36.0–46.0)
Hemoglobin: 8.7 g/dL — ABNORMAL LOW (ref 12.0–15.0)

## 2019-04-23 LAB — PREPARE RBC (CROSSMATCH)

## 2019-04-23 SURGERY — HYSTERECTOMY, VAGINAL, LAPAROSCOPY-ASSISTED, WITH SALPINGO-OOPHORECTOMY
Anesthesia: General | Laterality: Bilateral

## 2019-04-23 MED ORDER — ONDANSETRON HCL 4 MG PO TABS: 4.0000 mg | ORAL_TABLET | Freq: Four times a day (QID) | ORAL | 0 refills | Status: DC | PRN

## 2019-04-23 MED ORDER — LIDOCAINE HCL (CARDIAC) PF 100 MG/5ML IV SOSY
PREFILLED_SYRINGE | INTRAVENOUS | Status: DC | PRN
  Administered 2019-04-23: 100 mg via INTRAVENOUS

## 2019-04-23 MED ORDER — DOCUSATE SODIUM 100 MG PO CAPS: 100.0000 mg | ORAL_CAPSULE | Freq: Every day | ORAL | 2 refills | Status: AC | PRN

## 2019-04-23 MED ORDER — ONDANSETRON HCL 4 MG/2ML IJ SOLN: 4.0000 mg | Freq: Four times a day (QID) | INTRAMUSCULAR | Status: DC | PRN

## 2019-04-23 MED ORDER — PROPOFOL 10 MG/ML IV BOLUS
INTRAVENOUS | Status: AC
  Filled 2019-04-23: qty 20

## 2019-04-23 MED ORDER — BUPIVACAINE HCL (PF) 0.5 % IJ SOLN
INTRAMUSCULAR | Status: AC
  Filled 2019-04-23: qty 30

## 2019-04-23 MED ORDER — ONDANSETRON HCL 4 MG PO TABS
4.0000 mg | ORAL_TABLET | Freq: Four times a day (QID) | ORAL | Status: DC | PRN
  Administered 2019-04-23: 4 mg via ORAL
  Filled 2019-04-23: qty 1

## 2019-04-23 MED ORDER — ONDANSETRON HCL 4 MG/2ML IJ SOLN: 4.0000 mg | Freq: Once | INTRAMUSCULAR | Status: DC | PRN

## 2019-04-23 MED ORDER — FENTANYL CITRATE (PF) 100 MCG/2ML IJ SOLN
INTRAMUSCULAR | Status: AC
  Filled 2019-04-23: qty 2

## 2019-04-23 MED ORDER — FENTANYL CITRATE (PF) 100 MCG/2ML IJ SOLN
25.0000 ug | INTRAMUSCULAR | Status: DC | PRN
  Administered 2019-04-23 (×4): 25 ug via INTRAVENOUS

## 2019-04-23 MED ORDER — IBUPROFEN 800 MG PO TABS: 800.0000 mg | ORAL_TABLET | Freq: Three times a day (TID) | ORAL | 0 refills | Status: DC | PRN

## 2019-04-23 MED ORDER — KETOROLAC TROMETHAMINE 30 MG/ML IJ SOLN: 30.0000 mg | Freq: Three times a day (TID) | INTRAMUSCULAR | Status: DC | PRN

## 2019-04-23 MED ORDER — LIDOCAINE-EPINEPHRINE 1 %-1:100000 IJ SOLN
INTRAMUSCULAR | Status: DC | PRN
  Administered 2019-04-23: 13 mL

## 2019-04-23 MED ORDER — MIDAZOLAM HCL 2 MG/2ML IJ SOLN
INTRAMUSCULAR | Status: AC
  Filled 2019-04-23: qty 2

## 2019-04-23 MED ORDER — LACTATED RINGERS IV SOLN
INTRAVENOUS | Status: DC
  Administered 2019-04-23: 12:00:00 via INTRAVENOUS

## 2019-04-23 MED ORDER — ALUM & MAG HYDROXIDE-SIMETH 200-200-20 MG/5ML PO SUSP: 30.0000 mL | ORAL | Status: DC | PRN

## 2019-04-23 MED ORDER — OXYCODONE-ACETAMINOPHEN 5-325 MG PO TABS: 1.0000 | ORAL_TABLET | Freq: Four times a day (QID) | ORAL | 0 refills | Status: DC | PRN

## 2019-04-23 MED ORDER — OXYCODONE HCL 5 MG PO TABS
5.0000 mg | ORAL_TABLET | ORAL | Status: DC | PRN
  Administered 2019-04-23: 10 mg via ORAL
  Administered 2019-04-23: 5 mg via ORAL
  Filled 2019-04-23: qty 1
  Filled 2019-04-23: qty 2

## 2019-04-23 MED ORDER — PROPOFOL 10 MG/ML IV BOLUS
INTRAVENOUS | Status: DC | PRN
  Administered 2019-04-23: 200 mg via INTRAVENOUS

## 2019-04-23 MED ORDER — LIDOCAINE-EPINEPHRINE 1 %-1:100000 IJ SOLN
INTRAMUSCULAR | Status: AC
  Filled 2019-04-23: qty 1

## 2019-04-23 MED ORDER — FENTANYL CITRATE (PF) 100 MCG/2ML IJ SOLN
INTRAMUSCULAR | Status: DC | PRN
  Administered 2019-04-23 (×4): 50 ug via INTRAVENOUS

## 2019-04-23 MED ORDER — BUPIVACAINE HCL 0.5 % IJ SOLN
INTRAMUSCULAR | Status: DC | PRN
  Administered 2019-04-23: 13 mL

## 2019-04-23 MED ORDER — SUCCINYLCHOLINE CHLORIDE 20 MG/ML IJ SOLN
INTRAMUSCULAR | Status: DC | PRN
  Administered 2019-04-23: 120 mg via INTRAVENOUS

## 2019-04-23 MED ORDER — MORPHINE SULFATE (PF) 2 MG/ML IV SOLN
1.0000 mg | INTRAVENOUS | Status: DC | PRN
  Administered 2019-04-23: 2 mg via INTRAVENOUS
  Administered 2019-04-23: 1 mg via INTRAVENOUS
  Filled 2019-04-23 (×2): qty 1

## 2019-04-23 MED ORDER — SILVER NITRATE-POT NITRATE 75-25 % EX MISC
CUTANEOUS | Status: AC
  Filled 2019-04-23: qty 1

## 2019-04-23 MED ORDER — ROCURONIUM BROMIDE 100 MG/10ML IV SOLN
INTRAVENOUS | Status: DC | PRN
  Administered 2019-04-23: 10 mg via INTRAVENOUS
  Administered 2019-04-23 (×2): 20 mg via INTRAVENOUS
  Administered 2019-04-23: 10 mg via INTRAVENOUS

## 2019-04-23 MED ORDER — GABAPENTIN 300 MG PO CAPS: 300.0000 mg | ORAL_CAPSULE | Freq: Every day | ORAL | Status: DC

## 2019-04-23 MED ORDER — SODIUM CHLORIDE 0.9% IV SOLUTION
Freq: Once | INTRAVENOUS | Status: AC
  Administered 2019-04-23: 02:00:00 via INTRAVENOUS

## 2019-04-23 MED ORDER — FENTANYL CITRATE (PF) 100 MCG/2ML IJ SOLN
INTRAMUSCULAR | Status: AC
  Administered 2019-04-23: 11:00:00 25 ug via INTRAVENOUS
  Filled 2019-04-23: qty 2

## 2019-04-23 MED ORDER — KETOROLAC TROMETHAMINE 30 MG/ML IJ SOLN
INTRAMUSCULAR | Status: DC | PRN
  Administered 2019-04-23: 30 mg via INTRAVENOUS

## 2019-04-23 MED ORDER — ACETAMINOPHEN 500 MG PO TABS
1000.0000 mg | ORAL_TABLET | Freq: Four times a day (QID) | ORAL | Status: DC
  Administered 2019-04-23 (×2): 1000 mg via ORAL
  Filled 2019-04-23 (×2): qty 2

## 2019-04-23 MED ORDER — DEXAMETHASONE SODIUM PHOSPHATE 10 MG/ML IJ SOLN
INTRAMUSCULAR | Status: DC | PRN
  Administered 2019-04-23: 10 mg via INTRAVENOUS

## 2019-04-23 MED ORDER — GABAPENTIN 300 MG PO CAPS: 300.0000 mg | ORAL_CAPSULE | Freq: Every day | ORAL | 0 refills | Status: AC

## 2019-04-23 MED ORDER — SUGAMMADEX SODIUM 200 MG/2ML IV SOLN
INTRAVENOUS | Status: DC | PRN
  Administered 2019-04-23: 200 mg via INTRAVENOUS

## 2019-04-23 MED ORDER — ONDANSETRON HCL 4 MG/2ML IJ SOLN
INTRAMUSCULAR | Status: DC | PRN
  Administered 2019-04-23: 4 mg via INTRAVENOUS

## 2019-04-23 SURGICAL SUPPLY — 50 items
BAG URINE DRAINAGE (UROLOGICAL SUPPLIES) ×3 IMPLANT
BLADE SURG SZ11 CARB STEEL (BLADE) ×3 IMPLANT
CATH FOLEY 2WAY  5CC 16FR (CATHETERS) ×2
CATH ROBINSON RED A/P 16FR (CATHETERS) ×3 IMPLANT
CATH URTH 16FR FL 2W BLN LF (CATHETERS) ×1 IMPLANT
CHLORAPREP W/TINT 26 (MISCELLANEOUS) ×3 IMPLANT
CLOSURE WOUND 1/2 X4 (GAUZE/BANDAGES/DRESSINGS) ×1
COVER WAND RF STERILE (DRAPES) ×3 IMPLANT
DERMABOND ADVANCED (GAUZE/BANDAGES/DRESSINGS) ×2
DERMABOND ADVANCED .7 DNX12 (GAUZE/BANDAGES/DRESSINGS) ×1 IMPLANT
DRAPE SURG 17X11 SM STRL (DRAPES) ×3 IMPLANT
DRSG TEGADERM 2-3/8X2-3/4 SM (GAUZE/BANDAGES/DRESSINGS) ×12 IMPLANT
ELECT REM PT RETURN 9FT ADLT (ELECTROSURGICAL) ×3
ELECTRODE REM PT RTRN 9FT ADLT (ELECTROSURGICAL) ×1 IMPLANT
FILTER LAP SMOKE EVAC STRL (MISCELLANEOUS) ×3 IMPLANT
GAUZE 4X4 16PLY RFD (DISPOSABLE) ×3 IMPLANT
GLOVE BIO SURGEON STRL SZ8 (GLOVE) ×12 IMPLANT
GOWN STRL REUS W/ TWL LRG LVL3 (GOWN DISPOSABLE) ×3 IMPLANT
GOWN STRL REUS W/ TWL XL LVL3 (GOWN DISPOSABLE) ×3 IMPLANT
GOWN STRL REUS W/TWL LRG LVL3 (GOWN DISPOSABLE) ×6
GOWN STRL REUS W/TWL XL LVL3 (GOWN DISPOSABLE) ×6
GRASPER SUT TROCAR 14GX15 (MISCELLANEOUS) IMPLANT
HANDLE YANKAUER SUCT BULB TIP (MISCELLANEOUS) ×3 IMPLANT
IRRIGATION STRYKERFLOW (MISCELLANEOUS) ×1 IMPLANT
IRRIGATOR STRYKERFLOW (MISCELLANEOUS) ×3
KIT PINK PAD W/HEAD ARE REST (MISCELLANEOUS) ×3
KIT PINK PAD W/HEAD ARM REST (MISCELLANEOUS) ×1 IMPLANT
KIT TURNOVER CYSTO (KITS) ×3 IMPLANT
LABEL OR SOLS (LABEL) ×3 IMPLANT
NEEDLE HYPO 22GX1.5 SAFETY (NEEDLE) ×3 IMPLANT
PACK BASIN MINOR ARMC (MISCELLANEOUS) ×3 IMPLANT
PACK GYN LAPAROSCOPIC (MISCELLANEOUS) ×3 IMPLANT
PAD OB MATERNITY 4.3X12.25 (PERSONAL CARE ITEMS) ×3 IMPLANT
SCISSORS METZENBAUM CVD 33 (INSTRUMENTS) IMPLANT
SHEARS HARMONIC ACE PLUS 36CM (ENDOMECHANICALS) ×3 IMPLANT
SLEEVE ENDOPATH XCEL 5M (ENDOMECHANICALS) ×6 IMPLANT
SPONGE GAUZE 2X2 8PLY STER LF (GAUZE/BANDAGES/DRESSINGS) ×3
SPONGE GAUZE 2X2 8PLY STRL LF (GAUZE/BANDAGES/DRESSINGS) ×6 IMPLANT
STRIP CLOSURE SKIN 1/2X4 (GAUZE/BANDAGES/DRESSINGS) ×2 IMPLANT
SUT VIC AB 0 CT1 27 (SUTURE) ×4
SUT VIC AB 0 CT1 27XCR 8 STRN (SUTURE) ×2 IMPLANT
SUT VIC AB 0 CT1 36 (SUTURE) ×6 IMPLANT
SUT VIC AB 0 CT2 27 (SUTURE) ×3 IMPLANT
SUT VIC AB 2-0 UR6 27 (SUTURE) ×3 IMPLANT
SUT VIC AB 4-0 SH 27 (SUTURE) ×2
SUT VIC AB 4-0 SH 27XANBCTRL (SUTURE) ×1 IMPLANT
SYR 10ML LL (SYRINGE) ×3 IMPLANT
SYR CONTROL 10ML (SYRINGE) ×3 IMPLANT
TROCAR XCEL NON-BLD 5MMX100MML (ENDOMECHANICALS) ×3 IMPLANT
TUBING EVAC SMOKE HEATED PNEUM (TUBING) ×3 IMPLANT

## 2019-04-23 NOTE — Anesthesia Post-op Follow-up Note (Signed)
Anesthesia QCDR form completed.        

## 2019-04-23 NOTE — Anesthesia Procedure Notes (Signed)
Procedure Name: Intubation Date/Time: 04/23/2019 7:48 AM Performed by: Philbert Riser, CRNA Pre-anesthesia Checklist: Patient identified, Emergency Drugs available, Suction available, Patient being monitored and Timeout performed Patient Re-evaluated:Patient Re-evaluated prior to induction Oxygen Delivery Method: Circle system utilized and Simple face mask Preoxygenation: Pre-oxygenation with 100% oxygen Induction Type: IV induction Ventilation: Mask ventilation without difficulty Laryngoscope Size: Mac and 3 Grade View: Grade I Tube type: Oral Tube size: 7.0 mm Number of attempts: 1 Airway Equipment and Method: Stylet Placement Confirmation: ETT inserted through vocal cords under direct vision,  positive ETCO2 and breath sounds checked- equal and bilateral Secured at: 21 cm Tube secured with: Tape Dental Injury: Teeth and Oropharynx as per pre-operative assessment

## 2019-04-23 NOTE — Anesthesia Preprocedure Evaluation (Signed)
Anesthesia Evaluation  Patient identified by MRN, date of birth, ID band Patient awake    Reviewed: Allergy & Precautions, H&P , NPO status , Patient's Chart, lab work & pertinent test results  History of Anesthesia Complications (+) history of anesthetic complications  Airway Mallampati: II  TM Distance: >3 FB Neck ROM: full    Dental no notable dental hx.    Pulmonary    Pulmonary exam normal breath sounds clear to auscultation       Cardiovascular negative cardio ROS Normal cardiovascular exam Rhythm:regular Rate:Normal     Neuro/Psych Seizures -, Well Controlled,  negative psych ROS   GI/Hepatic Neg liver ROS, GERD  ,  Endo/Other  Morbid obesity  Renal/GU negative Renal ROS  Female GU complaint     Musculoskeletal negative musculoskeletal ROS (+)   Abdominal   Peds negative pediatric ROS (+)  Hematology  (+) anemia ,   Anesthesia Other Findings Past Medical History: No date: Anemia     Comment:  h/o No date: Complication of anesthesia     Comment:  HAD SEIZURE AFTER D&E X 1 11/08/2017: Cough No date: GERD (gastroesophageal reflux disease)     Comment:  occ  No date: Motion sickness     Comment:  cars, boats, planes No date: Seizures ([place])     Comment:  x1 - over 15 yrs ago, after miscarriage No date: Wears contact lenses  Reproductive/Obstetrics                             Anesthesia Physical  Anesthesia Plan  ASA: II  Anesthesia Plan: General   Post-op Pain Management:  Regional for Post-op pain   Induction: Intravenous  PONV Risk Score and Plan: 3 and Ondansetron, Dexamethasone, Propofol infusion and Treatment may vary due to age or medical condition  Airway Management Planned: Natural Airway  Additional Equipment:   Intra-op Plan:   Post-operative Plan:   Informed Consent: I have reviewed the patients History and Physical, chart, labs and discussed the  procedure including the risks, benefits and alternatives for the proposed anesthesia with the patient or authorized representative who has indicated his/her understanding and acceptance.       Plan Discussed with: CRNA  Anesthesia Plan Comments:         Anesthesia Quick Evaluation

## 2019-04-23 NOTE — Plan of Care (Signed)
Vs stable; up ad lib for now; pt going to have surgery today at 0730; pt has been NPO since midnight except for the pre op meds given at 0600 (tylenol and gabapentin); pre op antibiotic on pt's chart to go with pt to surgical area; pt will void prior to going to surgical area

## 2019-04-23 NOTE — Progress Notes (Signed)
Patient wheeled out by NT 

## 2019-04-23 NOTE — Progress Notes (Signed)
Nurse from [place] area called to get report on pt via telephone at this time

## 2019-04-23 NOTE — Progress Notes (Signed)
Pt to OR at this time via bed.

## 2019-04-23 NOTE — Progress Notes (Signed)
Patient discharged home. Discharge instructions, prescriptions and follow up appointment given to and reviewed with patient. Patient verbalized understanding. Patient eating dinner then will be escorted out by NT

## 2019-04-23 NOTE — Discharge Summary (Signed)
Physician Discharge Summary  Patient ID: Kim Craig MRN: [id number] DOB/AGE: 37/31/1982 37 y.o.  Admit date: 04/22/2019 Discharge date: 04/23/2019  Admission Diagnoses:severe anemia, menorrhagia ,   Discharge Diagnoses:  Active Problems:   Severe anemia   Menorrhagia   Postoperative state   Discharged Condition: good  Hospital Course: pt was admitted the day before her LAVH LSO and right salpingectom for HCT of 22 % . 3 unit blood transfusion given . Surgery uncomplicated EBL 600 cc with post op HCT  30.8 %  On d/c she was urinating well , tolerating reg diet and pain was in control.   Consults: None  Significant Diagnostic Studies: labs:  Results for orders placed or performed during the hospital encounter of 04/22/19 (from the past 24 hour(s))  Hemoglobin and hematocrit, blood     Status: Abnormal   Collection Time: 04/22/19 11:20 PM  Result Value Ref Range   Hemoglobin 7.6 (L) 12.0 - 15.0 g/dL   HCT 40.926.7 (L) 81.136.0 - 91.446.0 %  Prepare RBC     Status: None   Collection Time: 04/23/19  1:05 AM  Result Value Ref Range   Order Confirmation      ORDER PROCESSED BY BLOOD BANK Performed at [facility], [address] [place], [place] [id number]   Hemoglobin and hematocrit, blood     Status: Abnormal   Collection Time: 04/23/19  5:55 AM  Result Value Ref Range   Hemoglobin 8.7 (L) 12.0 - 15.0 g/dL   HCT 62.129.8 (L) 30.836.0 - 65.746.0 %  CBC     Status: Abnormal   Collection Time: 04/23/19  3:56 PM  Result Value Ref Range   WBC 10.2 4.0 - 10.5 K/uL   RBC 4.43 3.87 - 5.11 MIL/uL   Hemoglobin 9.0 (L) 12.0 - 15.0 g/dL   HCT 84.630.8 (L) 96.236.0 - 95.246.0 %   MCV 69.5 (L) 80.0 - 100.0 fL   MCH 20.3 (L) 26.0 - 34.0 pg   MCHC 29.2 (L) 30.0 - 36.0 g/dL   RDW 84.126.5 (H) 32.411.5 - 40.115.5 %   Platelets 326 150 - 400 K/uL   nRBC 0.0 0.0 - 0.2 %  Basic metabolic panel     Status: Abnormal   Collection Time: 04/23/19  3:56 PM  Result Value Ref Range   Sodium 138 135 - 145 mmol/L   Potassium 4.2  3.5 - 5.1 mmol/L   Chloride 108 98 - 111 mmol/L   CO2 23 22 - 32 mmol/L   Glucose, Bld 133 (H) 70 - 99 mg/dL   BUN 10 6 - 20 mg/dL   Creatinine, Ser 0.270.68 0.44 - 1.00 mg/dL   Calcium 8.3 (L) 8.9 - 10.3 mg/dL   GFR calc non Af Amer >60 >60 mL/min   GFR calc Af Amer >60 >60 mL/min   Anion gap 7 5 - 15    Treatments: surgery: as above  Discharge Exam: Blood pressure 116/73, pulse (!) 58, temperature 98.1 F (36.7 C), temperature source Oral, resp. rate 18, height 5\' 4"  (1.626 m), weight 100.7 kg, last menstrual period 04/06/2019, SpO2 98 %, unknown if currently breastfeeding. General appearance: alert and cooperative Cardio: regular rate and rhythm, S1, S2 normal, no murmur, click, rub or gallop GI: incisions , small drainage for infraunlical incision   Disposition: Discharge disposition: 01-Home or Self Care       Discharge Instructions    Call MD for:  difficulty breathing, headache or visual disturbances   Complete by: As directed  Call MD for:  extreme fatigue   Complete by: As directed    Call MD for:  hives   Complete by: As directed    Call MD for:  persistant dizziness or light-headedness   Complete by: As directed    Call MD for:  persistant nausea and vomiting   Complete by: As directed    Call MD for:  redness, tenderness, or signs of infection (pain, swelling, redness, odor or green/yellow discharge around incision site)   Complete by: As directed    Call MD for:  severe uncontrolled pain   Complete by: As directed    Call MD for:  temperature >100.4   Complete by: As directed    Diet - low sodium heart healthy   Complete by: As directed    Increase activity slowly   Complete by: As directed      Allergies as of 04/23/2019      Reactions   Metronidazole Nausea Only      Medication List    STOP taking these medications   calcium carbonate 500 MG chewable tablet Commonly known as: TUMS - dosed in mg elemental calcium   ferrous sulfate 325 (65 FE) MG  tablet   Nexplanon 68 MG Impl implant Generic drug: etonogestrel   ondansetron 4 MG disintegrating tablet Commonly known as: Zofran ODT     TAKE these medications   docusate sodium 100 MG capsule Commonly known as: Colace Take 1 capsule (100 mg total) by mouth daily as needed.   gabapentin 300 MG capsule Commonly known as: NEURONTIN Take 1 capsule (300 mg total) by mouth at bedtime.   ibuprofen 800 MG tablet Commonly known as: ADVIL Take 1 tablet (800 mg total) by mouth every 8 (eight) hours as needed.   ipratropium 17 MCG/ACT inhaler Commonly known as: ATROVENT HFA Inhale 2 puffs into the lungs 3 (three) times daily as needed for wheezing.   ondansetron 4 MG tablet Commonly known as: ZOFRAN Take 1 tablet (4 mg total) by mouth every 6 (six) hours as needed for nausea.   oxyCODONE-acetaminophen 5-325 MG tablet Commonly known as: Percocet Take 1-2 tablets by mouth every 6 (six) hours as needed for severe pain.   valACYclovir 500 MG tablet Commonly known as: VALTREX Take 500 mg by mouth as needed.      Follow-up Information    Schermerhorn, Gwen Her, MD Follow up in 2 week(s).   Specialty: Obstetrics and Gynecology Why: video visit Contact information: [address] [place] [address] [phone]           Signed: Gwen Her Schermerhorn 04/23/2019, 5:20 PM

## 2019-04-23 NOTE — Op Note (Signed)
NAME: Kim Craig, Kim Craig MEDICAL RECORD QP:[id number] ACCOUNT [id number] DATE OF BIRTH:Sep 18, 1981 FACILITY: [facility] LOCATION: [facility] PHYSICIAN:THOMAS Josefine Class, MD  OPERATIVE REPORT  DATE OF PROCEDURE:  04/23/2019  PREOPERATIVE DIAGNOSES: 1.  Severe anemia. 2.  Menorrhagia. 3.  Complex solid left ovarian cyst.  POSTOPERATIVE DIAGNOSES: 1.  Severe anemia. 2.  Menorrhagia. 3.  Complex solid left ovarian cyst.  PROCEDURE: 1.  Laparoscopic-assisted vaginal hysterectomy. 2.  Left salpingo-oophorectomy. 3.  Right salpingectomy.  ANESTHESIA:  General endotracheal anesthesia.  SURGEON:  Laverta Baltimore, MD  FIRST ASSISTANT:  Benjaman Kindler, MD  INDICATIONS:  A 38 year old female with a long history of menorrhagia.  The patient stated that she had to wear diapers with her period which lasted up to 12 days.  The patient was noted to have a solid left ovarian cyst measuring 2.0x 1.6 cm, which  did not resolve with expectant management.  Given the patient's severe preoperative anemia with hematocrit of 22%, the patient was brought into the hospital the day before the procedure and received 3 units of packed red blood cells.  DESCRIPTION OF PROCEDURE:  After adequate general endotracheal anesthesia, the patient was placed in dorsal supine position.  Legs were placed in the [place].  Abdomen, perineum, and vagina were prepped and draped in normal sterile fashion.   Timeout was performed.  The patient did receive 3 g of IV Ancef for surgical prophylaxis prior to commencement of the case.  Straight catheterization of the bladder yielded 200 mL urine.  A speculum was placed in the vagina, and a Hulka tenaculum was  placed on the cervix and into the endometrial cavity.  This was to be used for uterine manipulation during the procedure.  Gloves were changed and attention was directed to the patient's abdomen where a 5 mm infraumbilical incision was made.  The 5 mm  laparoscope was  advanced into the abdominal cavity under direct visualization with the Optiview cannula.  Second port site was placed in the left lower quadrant 3 cm medial to the left anterior iliac spine, and under direct visualization, a 5 mm port was  placed.  A similar port was placed on the right side, again after injecting with 0.5% Marcaine, and under direct visualization, a 5 mm trocar was advanced.  Initial impression revealed a globular bulbous uterus, a slightly enlarged ovary with increased  vascularity on the left.  Ureters appeared normal with normal peristaltic activity prior to incisions.  The left infundibulopelvic ligament was cauterized with the Kleppinger cautery followed by transecting with the Harmonic scalpel.  Broad ligament was  incised after the left round ligament was clamped and incised.  The anterior bladder flap was dissected and developed with the Harmonic scalpel.  The left uterine artery was skeletonized and cauterized with the Kleppingers, followed by transecting with  the Harmonic scalpel.  There was some bleeding that ensued on this side, which ultimately was controlled.  Attention was then directed to the patient's right side of the uterus where the right mesosalpinx was dissected away from the right ovary.  The  broad ligament was incised, and likewise the right uterine artery was identified, skeletonized, cauterized, and transected.  Good hemostasis was noted, further development of the anterior cervix.  Attention was then directed vaginally where the cervix  was grasped with 2 thyroid tenacula.  The cervix was circumferentially injected with 1% lidocaine with 1:100,000 epinephrine.  A direct posterior colpotomy incision was made upon entry into the posterior cul-de-sac.  The peritoneum was tagged  to the  vaginal cuff to be identified later.  A long-billed weighted speculum was placed into the posterior cul-de-sac, and the uterosacral ligaments were bilaterally clamped, transected, and  suture ligated with 0 Vicryl suture.  The anterior cervix was incised  with the Bovie, followed by bilaterally clamping the cardinal ligaments, transecting and suture ligated.  Ultimately, the anterior cul-de-sac was entered sharply, and the Deaver was placed within the anterior cul-de-sac and the bladder was elevated  anteriorly.  Two additional clamps were used to communicate with the previously dissected areas, and these pedicles were ligated with 0 Vicryl suture.  The cervix, uterus, bilateral fallopian tubes, and left ovary were then delivered through the vaginal  site.  Good hemostasis was noted.  The vaginal cuff was then closed with a running 0 Vicryl suture.  The uterosacral ligaments were plicated centrally to aid for apical support, and the rest of the vaginal cuff was closed with the 0 Vicryl suture.  Good  hemostasis was noted.  Gloves were changed and the patient's abdomen was reinsufflated.  Good hemostasis was noted on all pedicles.  There was normal peristaltic activity of each ureter.  Intraoperative pictures were taken.  Intraabdominal pressure was  lowered to 7 mmHg and good hemostasis resulted.  The appendix could not be visualized.  The upper abdomen appeared normal.  The patient's abdomen was then deflated, and the 3 incisions were closed with interrupted 4-0 Vicryl suture.  Sterile dressings  were applied.  COMPLICATIONS:  There were no complications.  ESTIMATED BLOOD LOSS:  600 mL.  INTRAOPERATIVE FLUIDS:  1800 mL.  URINE OUTPUT:  300 mL.  Of note, a Foley catheter was placed at the end of the case.  It yielded an additional 100 mL of urine.  DISPOSITION:  The patient was taken to recovery room in good condition.  LN/NUANCE  D:04/23/2019 T:04/23/2019 JOB:[id number]

## 2019-04-23 NOTE — Transfer of Care (Signed)
Immediate Anesthesia Transfer of Care Note  Patient: Kim Craig  Procedure(s) Performed: LAPAROSCOPIC ASSISTED VAGINAL HYSTERECTOMY WITH LEFT SALPINGO OOPHORECTOMY AND RIGHT SALPINGECTOMY (Bilateral )  Patient Location: PACU  Anesthesia Type:General  Level of Consciousness: awake and alert   Airway & Oxygen Therapy: Patient Spontanous Breathing and Patient connected to face mask oxygen  Post-op Assessment: Report given to RN and Post -op Vital signs reviewed and stable  Post vital signs: Reviewed and stable  Last Vitals:  Vitals Value Taken Time  BP    Temp    Pulse    Resp    SpO2      Last Pain:  Vitals:   04/23/19 0645  TempSrc: Oral  PainSc: 0-No pain         Complications: No apparent anesthesia complications

## 2019-04-23 NOTE — Progress Notes (Signed)
RN notified OR charge nurse at this time to let OR charge nurse know that the pt is in room 347; this pt is scheduled to have surgery at 0730; OR will come and get pt on her bed in the morning

## 2019-04-23 NOTE — Progress Notes (Signed)
Pt admitted yesterday for severe anemia . She has received 3 units of blood . Feeling better . She is NPO and ready for LAVh and LSO and right salpingectomy  Results for orders placed or performed during the hospital encounter of 04/22/19 (from the past 24 hour(s))  Type and screen [place]     Status: None (Preliminary result)   Collection Time: 04/22/19  2:21 PM  Result Value Ref Range   ABO/RH(D) A POS    Antibody Screen NEG    Sample Expiration 04/25/2019,2359    Unit Number [id number]    Blood Component Type RBC LR PHER2    Unit division 00    Status of Unit ISSUED    Transfusion Status OK TO TRANSFUSE    Crossmatch Result Compatible    Unit Number [id number]    Blood Component Type RED CELLS,LR    Unit division 00    Status of Unit ISSUED    Transfusion Status OK TO TRANSFUSE    Crossmatch Result Compatible    Unit Number [id number]    Blood Component Type RED CELLS,LR    Unit division 00    Status of Unit ISSUED    Transfusion Status OK TO TRANSFUSE    Crossmatch Result      Compatible Performed at [facility], [address] [place], [place] [address]   Hemoglobin and Hematocrit     Status: Abnormal   Collection Time: 04/22/19  2:21 PM  Result Value Ref Range   Hemoglobin 6.1 (L) 12.0 - 15.0 g/dL   HCT 22.9 (L) 36.0 - 46.0 %  Prepare RBC     Status: None   Collection Time: 04/22/19  2:30 PM  Result Value Ref Range   Order Confirmation      ORDER PROCESSED BY BLOOD BANK Performed at [facility], [address] [place], [place] [address]   Pregnancy, urine     Status: None   Collection Time: 04/22/19  2:51 PM  Result Value Ref Range   Preg Test, Ur NEGATIVE NEGATIVE  Hemoglobin and hematocrit, blood     Status: Abnormal   Collection Time: 04/22/19 11:20 PM  Result Value Ref Range   Hemoglobin 7.6 (L) 12.0 - 15.0 g/dL   HCT 26.7 (L) 36.0 - 46.0 %  Prepare RBC     Status: None   Collection Time: 04/23/19   1:05 AM  Result Value Ref Range   Order Confirmation      ORDER PROCESSED BY BLOOD BANK Performed at [facility], [address] [place], [place] [address]   Hemoglobin and hematocrit, blood     Status: Abnormal   Collection Time: 04/23/19  5:55 AM  Result Value Ref Range   Hemoglobin 8.7 (L) 12.0 - 15.0 g/dL   HCT 29.8 (L) 36.0 - 46.0 %    Neg HCG and Neg Covid  Proceed with surgery

## 2019-04-23 NOTE — Brief Op Note (Signed)
04/22/2019 - 04/23/2019  10:17 AM  PATIENT:  Kim Craig  38 y.o. female  PRE-OPERATIVE DIAGNOSIS:  menorrhagia, complex left ovarian cyst  POST-OPERATIVE DIAGNOSIS:  menorrhagia, complex left ovarian cyst  PROCEDURE:  Procedure(s): LAPAROSCOPIC ASSISTED VAGINAL HYSTERECTOMY WITH LEFT SALPINGO OOPHORECTOMY AND RIGHT SALPINGECTOMY (Bilateral)  SURGEON:  Surgeon(s) and Role:    * Jerrel Tiberio, Gwen Her, MD - Primary    * Benjaman Kindler, MD - Assisting  PHYSICIAN ASSISTANT:   ASSISTANTS:scrub tech  ANESTHESIA:   general  EBL:  600 mL   BLOOD ADMINISTERED:none  DRAINS: Urinary Catheter (Foley)   LOCAL MEDICATIONS USED:  MARCAINE     SPECIMEN:  Source of Specimen:  cervix , uterus left tube and ovary and right tube   DISPOSITION OF SPECIMEN:  PATHOLOGY  COUNTS:  YES  TOURNIQUET:  * No tourniquets in log *  DICTATION: .Other Dictation: Dictation Number verbal  PLAN OF CARE: Admit for overnight observation  PATIENT DISPOSITION:  PACU - hemodynamically stable.   Delay start of Pharmacological VTE agent (>24hrs) due to surgical blood loss or risk of bleeding: not applicable

## 2019-04-24 LAB — TYPE AND SCREEN
ABO/RH(D): A POS
Antibody Screen: NEGATIVE
Unit division: 0
Unit division: 0
Unit division: 0

## 2019-04-24 LAB — BPAM RBC
Blood Product Expiration Date: 202008052359
Blood Product Expiration Date: 202008052359
Blood Product Expiration Date: 202008062359
ISSUE DATE / TIME: 202007051530
ISSUE DATE / TIME: 202007051844
ISSUE DATE / TIME: 202007060137
Unit Type and Rh: 6200
Unit Type and Rh: 6200
Unit Type and Rh: 6200

## 2019-04-24 NOTE — Anesthesia Postprocedure Evaluation (Signed)
Anesthesia Post Note  Patient: Kim Craig  Procedure(s) Performed: LAPAROSCOPIC ASSISTED VAGINAL HYSTERECTOMY WITH LEFT SALPINGO OOPHORECTOMY AND RIGHT SALPINGECTOMY (Bilateral )  Patient location during evaluation: PACU Anesthesia Type: General Level of consciousness: awake and alert and oriented Pain management: pain level controlled Vital Signs Assessment: post-procedure vital signs reviewed and stable Respiratory status: spontaneous breathing Cardiovascular status: blood pressure returned to baseline Anesthetic complications: no     Last Vitals:  Vitals:   04/23/19 1440 04/23/19 1641  BP: 138/80 116/73  Pulse: (!) 55 (!) 58  Resp: 16 18  Temp: 36.5 C 36.7 C  SpO2: 99% 98%    Last Pain:  Vitals:   04/23/19 1820  TempSrc:   PainSc: 5                  Allanna Bresee

## 2019-04-25 LAB — SURGICAL PATHOLOGY

## 2019-04-30 ENCOUNTER — Encounter: Payer: Self-pay | Admitting: Obstetrics and Gynecology

## 2019-08-30 ENCOUNTER — Other Ambulatory Visit: Payer: Self-pay

## 2019-08-30 ENCOUNTER — Encounter: Payer: Self-pay | Admitting: Emergency Medicine

## 2019-08-30 ENCOUNTER — Emergency Department: Payer: 59

## 2019-08-30 ENCOUNTER — Emergency Department
Admission: EM | Admit: 2019-08-30 | Discharge: 2019-08-30 | Disposition: A | Discharge status: Home / Self Care | Payer: 59 | Attending: Emergency Medicine | Admitting: Emergency Medicine

## 2019-08-30 DIAGNOSIS — G43109 Migraine with aura, not intractable, without status migrainosus: Secondary | ICD-10-CM | POA: Diagnosis not present

## 2019-08-30 DIAGNOSIS — Z79899 Other long term (current) drug therapy: Secondary | ICD-10-CM | POA: Insufficient documentation

## 2019-08-30 DIAGNOSIS — R200 Anesthesia of skin: Secondary | ICD-10-CM | POA: Diagnosis present

## 2019-08-30 LAB — COMPREHENSIVE METABOLIC PANEL
ALT: 12 U/L (ref 0–44)
AST: 13 U/L — ABNORMAL LOW (ref 15–41)
Albumin: 3.7 g/dL (ref 3.5–5.0)
Alkaline Phosphatase: 77 U/L (ref 38–126)
Anion gap: 10 (ref 5–15)
BUN: 10 mg/dL (ref 6–20)
CO2: 23 mmol/L (ref 22–32)
Calcium: 9.2 mg/dL (ref 8.9–10.3)
Chloride: 107 mmol/L (ref 98–111)
Creatinine, Ser: 0.76 mg/dL (ref 0.44–1.00)
GFR calc Af Amer: >60 mL/min (ref >60)
GFR calc non Af Amer: >60 mL/min (ref >60)
Glucose, Bld: 107 mg/dL — ABNORMAL HIGH (ref 70–99)
Potassium: 3.8 mmol/L (ref 3.5–5.1)
Sodium: 140 mmol/L (ref 135–145)
Total Bilirubin: 0.5 mg/dL (ref 0.3–1.2)
Total Protein: 7.6 g/dL (ref 6.5–8.1)

## 2019-08-30 LAB — APTT: aPTT: 28 seconds (ref 24–36)

## 2019-08-30 LAB — CBC
HCT: 36 % (ref 36.0–46.0)
Hemoglobin: 11.7 g/dL — ABNORMAL LOW (ref 12.0–15.0)
MCH: 25.8 pg — ABNORMAL LOW (ref 26.0–34.0)
MCHC: 32.5 g/dL (ref 30.0–36.0)
MCV: 79.5 fL — ABNORMAL LOW (ref 80.0–100.0)
Platelets: 337 10*3/uL (ref 150–400)
RBC: 4.53 MIL/uL (ref 3.87–5.11)
RDW: 14.7 % (ref 11.5–15.5)
WBC: 10.9 10*3/uL — ABNORMAL HIGH (ref 4.0–10.5)
nRBC: 0 % (ref 0.0–0.2)

## 2019-08-30 LAB — DIFFERENTIAL
Abs Immature Granulocytes: 0.03 10*3/uL (ref 0.00–0.07)
Basophils Absolute: 0 10*3/uL (ref 0.0–0.1)
Basophils Relative: 0 %
Eosinophils Absolute: 0 10*3/uL (ref 0.0–0.5)
Eosinophils Relative: 0 %
Immature Granulocytes: 0 %
Lymphocytes Relative: 19 %
Lymphs Abs: 2 10*3/uL (ref 0.7–4.0)
Monocytes Absolute: 0.4 10*3/uL (ref 0.1–1.0)
Monocytes Relative: 4 %
Neutro Abs: 8.3 10*3/uL — ABNORMAL HIGH (ref 1.7–7.7)
Neutrophils Relative %: 77 %

## 2019-08-30 LAB — PROTIME-INR
INR: 1.1 (ref 0.8–1.2)
Prothrombin Time: 14.3 seconds (ref 11.4–15.2)

## 2019-08-30 LAB — GLUCOSE, CAPILLARY: Glucose-Capillary: 111 mg/dL — ABNORMAL HIGH (ref 70–99)

## 2019-08-30 MED ORDER — DIPHENHYDRAMINE HCL 50 MG/ML IJ SOLN
25.0000 mg | Freq: Once | INTRAMUSCULAR | Status: AC
  Administered 2019-08-30: 19:00:00 25 mg via INTRAVENOUS
  Filled 2019-08-30: qty 1

## 2019-08-30 MED ORDER — PROCHLORPERAZINE EDISYLATE 10 MG/2ML IJ SOLN
10.0000 mg | Freq: Once | INTRAMUSCULAR | Status: AC
  Administered 2019-08-30: 10 mg via INTRAVENOUS
  Filled 2019-08-30: qty 2

## 2019-08-30 MED ORDER — KETOROLAC TROMETHAMINE 30 MG/ML IJ SOLN
15.0000 mg | Freq: Once | INTRAMUSCULAR | Status: AC
  Administered 2019-08-30: 15 mg via INTRAVENOUS
  Filled 2019-08-30: qty 1

## 2019-08-30 MED ORDER — BUTALBITAL-APAP-CAFFEINE 50-325-40 MG PO TABS: 1.0000 | ORAL_TABLET | Freq: Four times a day (QID) | ORAL | 0 refills | Status: AC | PRN

## 2019-08-30 MED ORDER — SODIUM CHLORIDE 0.9 % IV BOLUS
1000.0000 mL | Freq: Once | INTRAVENOUS | Status: AC
  Administered 2019-08-30: 1000 mL via INTRAVENOUS

## 2019-08-30 MED ORDER — SODIUM CHLORIDE 0.9% FLUSH: 3.0000 mL | Freq: Once | INTRAVENOUS | Status: DC

## 2019-08-30 MED ORDER — DEXAMETHASONE SODIUM PHOSPHATE 10 MG/ML IJ SOLN
10.0000 mg | Freq: Once | INTRAMUSCULAR | Status: AC
  Administered 2019-08-30: 10 mg via INTRAVENOUS
  Filled 2019-08-30: qty 1

## 2019-08-30 NOTE — ED Notes (Signed)
Soc finished; md counseled pt that this is probably not a stroke and she will confirm with EDP. Pt resting and awaiting next steps.

## 2019-08-30 NOTE — ED Triage Notes (Signed)
Pt here for left facial droop and left arm numbness/tingling.  Started at 1445.  Pt also felt like speech was slurring.  Speech appears WNL at this time.    1650-dr williams ok pt for ct  1652-in ct

## 2019-08-30 NOTE — Discharge Instructions (Addendum)
Drink plenty of fluids  Take the prescribed medication if headache returns  Follow-up with a Neurologist in 2-3 weeks, or as soon as able

## 2019-08-30 NOTE — ED Provider Notes (Signed)
[facility] Emergency Department Provider Note  ____________________________________________   First MD Initiated Contact with Patient 08/30/19 1701     (approximate)  I have reviewed the triage vital signs and the nursing notes.   HISTORY  Chief Complaint Numbness    HPI Kim Craig is a 38 y.o. female  With h/o prior migraines here with facial weakness, arm tingling. Pt reports that around 2:45 PM today, she began to notice she had tingling and sensation of imbalance on her left face. She states she began to have an aching, throbbing HA at that time. She then noticed her left arm began feeling slightly numb with tingling. She notified her significant other who felt like herl eft face was asymmetric so she was brought here for evaluation. No fever, chills. No h/o similar symptoms though she has had some migraines with "tingling" in her extremities before. No recent fever, chills. No neck pain or neck stiffness.       Past Medical History:  Diagnosis Date  . Anemia    h/o  . Complication of anesthesia    HAD SEIZURE AFTER D&E X 1  . Cough 11/08/2017  . GERD (gastroesophageal reflux disease)    occ   . Motion sickness    cars, boats, planes  . Seizures ([place])    x1 - over 15 yrs ago, after miscarriage  . Wears contact lenses     Patient Active Problem List   Diagnosis Date Noted  . Postoperative state 04/23/2019  . Severe anemia 04/22/2019  . Menorrhagia 04/22/2019    Past Surgical History:  Procedure Laterality Date  . DILATION AND CURETTAGE OF UTERUS    . LAPAROSCOPIC VAGINAL HYSTERECTOMY WITH SALPINGO OOPHORECTOMY Bilateral 04/23/2019   Procedure: LAPAROSCOPIC ASSISTED VAGINAL HYSTERECTOMY WITH LEFT SALPINGO OOPHORECTOMY AND RIGHT SALPINGECTOMY;  Surgeon: Schermerhorn, Gwen Her, MD;  Location: [facility];  Service: Gynecology;  Laterality: Bilateral;  . NO PAST SURGERIES    . SHOULDER ARTHROSCOPY Left 11/15/2017   Procedure: OPEN DISTAL  CLAVICLE EXCISION;  Surgeon: Leim Fabry, MD;  Location: [facility];  Service: Orthopedics;  Laterality: Left;  MAC/ REG WITH INTERSCALENE BLOCK BEACH CHAIR WITH SPYDER SIMPLY SLING  . UTERINE FIBROID EMBOLIZATION      Prior to Admission medications   Medication Sig Start Date End Date Taking? Authorizing Provider  butalbital-acetaminophen-caffeine (FIORICET) 50-325-40 MG tablet Take 1 tablet by mouth every 6 (six) hours as needed for headache or migraine. 08/30/19 08/29/20  Duffy Bruce, MD  docusate sodium (COLACE) 100 MG capsule Take 1 capsule (100 mg total) by mouth daily as needed. 04/23/19 04/22/20  Schermerhorn, Gwen Her, MD  gabapentin (NEURONTIN) 300 MG capsule Take 1 capsule (300 mg total) by mouth at bedtime. 04/23/19   Schermerhorn, Gwen Her, MD  ibuprofen (ADVIL) 800 MG tablet Take 1 tablet (800 mg total) by mouth every 8 (eight) hours as needed. 04/23/19   Schermerhorn, Gwen Her, MD  ipratropium (ATROVENT HFA) 17 MCG/ACT inhaler Inhale 2 puffs into the lungs 3 (three) times daily as needed for wheezing.    [provider]  ondansetron (ZOFRAN) 4 MG tablet Take 1 tablet (4 mg total) by mouth every 6 (six) hours as needed for nausea. 04/23/19   Schermerhorn, Gwen Her, MD  oxyCODONE-acetaminophen (PERCOCET) 5-325 MG tablet Take 1-2 tablets by mouth every 6 (six) hours as needed for severe pain. 04/23/19 04/22/20  Schermerhorn, Gwen Her, MD  valACYclovir (VALTREX) 500 MG tablet Take 500 mg by mouth as needed.  [provider]    Allergies Metronidazole  Family History  Problem Relation Age of Onset  . Diabetes Mother     Social History Social History   Tobacco Use  . Smoking status: Never Smoker  . Smokeless tobacco: Never Used  Substance Use Topics  . Alcohol use: Yes    Alcohol/week: 2.0 standard drinks    Types: 2 Glasses of wine per week    Comment: socially  . Drug use: No    Review of Systems  Review of Systems  Constitutional: Negative for  fatigue and fever.  HENT: Negative for congestion and sore throat.   Eyes: Negative for visual disturbance.  Respiratory: Negative for cough and shortness of breath.   Cardiovascular: Negative for chest pain.  Gastrointestinal: Negative for abdominal pain, diarrhea, nausea and vomiting.  Genitourinary: Negative for flank pain.  Musculoskeletal: Negative for back pain and neck pain.  Skin: Negative for rash and wound.  Neurological: Positive for facial asymmetry, weakness, numbness and headaches.  All other systems reviewed and are negative.    ____________________________________________  PHYSICAL EXAM:      VITAL SIGNS: ED Triage Vitals  Enc Vitals Group     BP 08/30/19 1647 118/77     Pulse Rate 08/30/19 1647 68     Resp 08/30/19 1647 18     Temp 08/30/19 1706 98.1 F (36.7 C)     Temp Source 08/30/19 1706 Oral     SpO2 08/30/19 1647 100 %     Weight 08/30/19 1706 230 lb (104.3 kg)     Height --      Head Circumference --      Peak Flow --      Pain Score --      Pain Loc --      Pain Edu? --      Excl. in [place]? --      Physical Exam Vitals signs and nursing note reviewed.  Constitutional:      General: She is not in acute distress.    Appearance: She is well-developed.  HENT:     Head: Normocephalic and atraumatic.  Eyes:     Conjunctiva/sclera: Conjunctivae normal.  Neck:     Musculoskeletal: Neck supple.  Cardiovascular:     Rate and Rhythm: Normal rate and regular rhythm.     Heart sounds: Normal heart sounds. No murmur. No friction rub.  Pulmonary:     Effort: Pulmonary effort is normal. No respiratory distress.     Breath sounds: Normal breath sounds. No wheezing or rales.  Abdominal:     General: There is no distension.     Palpations: Abdomen is soft.     Tenderness: There is no abdominal tenderness.  Skin:    General: Skin is warm.     Capillary Refill: Capillary refill takes less than 2 seconds.  Neurological:     Mental Status: She is alert  and oriented to person, place, and time.     Motor: No abnormal muscle tone.      Neurological Exam:  Mental Status: Alert and oriented to person, place, and time. Attention and concentration normal. Speech clear. Recent memory is intact. Cranial Nerves: Visual fields grossly intact. EOMI and PERRLA. No nystagmus noted. Facial sensation intact at forehead, maxillary cheek, and chin/mandible bilaterally. No facial asymmetry or weakness. Hearing grossly normal. Uvula is midline, and palate elevates symmetrically. Normal SCM and trapezius strength. Tongue midline without fasciculations. Motor: Muscle strength 5/5 in proximal and distal UE  and LE bilaterally. No pronator drift. Muscle tone normal. Sensation: Intact to light touch in upper and lower extremities distally bilaterally.  Gait: Normal without ataxia. Coordination: Normal FTN bilaterally.     ____________________________________________   LABS (all labs ordered are listed, but only abnormal results are displayed)  Labs Reviewed  GLUCOSE, CAPILLARY - Abnormal; Notable for the following components:      Result Value   Glucose-Capillary 111 (*)    All other components within normal limits  CBC - Abnormal; Notable for the following components:   WBC 10.9 (*)    Hemoglobin 11.7 (*)    MCV 79.5 (*)    MCH 25.8 (*)    All other components within normal limits  DIFFERENTIAL - Abnormal; Notable for the following components:   Neutro Abs 8.3 (*)    All other components within normal limits  COMPREHENSIVE METABOLIC PANEL - Abnormal; Notable for the following components:   Glucose, Bld 107 (*)    AST 13 (*)    All other components within normal limits  PROTIME-INR  APTT  I-STAT CREATININE, ED  CBG MONITORING, ED  POC URINE PREG, ED    ____________________________________________  EKG: Normal sinus rhythm, VR 61. QRS 83, QTc 415. No acute St elevations or depressions. No signs of ischemia or infarct.  ________________________________________  RADIOLOGY All imaging, including plain films, CT scans, and ultrasounds, independently reviewed by me, and interpretations confirmed via formal radiology reads.  ED MD interpretation:   CT Head: [place]  Official radiology report(s): Ct Head Code Stroke Wo Contrast  Result Date: 08/30/2019 CLINICAL DATA:  Code stroke.  Left-sided facial EXAM: CT HEAD WITHOUT CONTRAST TECHNIQUE: Contiguous axial images were obtained from the base of the skull through the vertex without intravenous contrast. COMPARISON:  None. FINDINGS: Brain: There is no acute intracranial hemorrhage, mass-effect, or edema. Gray-white differentiation is preserved. There is no extra-axial fluid collection. Ventricles and sulci are within normal limits in size and configuration. Vascular: No hyperdense vessel. Skull: Calvarium is unremarkable. Sinuses/Orbits: No acute finding. Other: None. ASPECTS [facility] Stroke Program Early CT Score) - Ganglionic level infarction (caudate, lentiform nuclei, internal capsule, insula, M1-M3 cortex): 7 - Supraganglionic infarction (M4-M6 cortex): 3 Total score (0-10 with 10 being normal): 10 IMPRESSION: 1. No acute intracranial hemorrhage or evidence of acute infarction. 2. ASPECTS is 10 These results were communicated to Dr. Myrene Buddy at 5:04 pmon 08/30/2019 Electronically Signed   By: Macy Mis M.D.   On: 08/30/2019 17:08    ____________________________________________  PROCEDURES   Procedure(s) performed (including Critical Care):  Procedures  ____________________________________________  INITIAL IMPRESSION / MDM / [place] / ED COURSE  As part of my medical decision making, I reviewed the following data within the [place] notes reviewed and incorporated, Old chart reviewed, Notes from prior ED visits, and [place] Controlled Substance Database       *Doyne D Helin was evaluated in Emergency Department on  08/30/2019 for the symptoms described in the history of present illness. She was evaluated in the context of the global COVID-19 pandemic, which necessitated consideration that the patient might be at risk for infection with the SARS-CoV-2 virus that causes COVID-19. Institutional protocols and algorithms that pertain to the evaluation of patients at risk for COVID-19 are in a state of rapid change based on information released by regulatory bodies including the CDC and federal and state organizations. These policies and algorithms were followed during the patient's care in the ED.  Some  ED evaluations and interventions may be delayed as a result of limited staffing during the pandemic.*     Medical Decision Making:  38 yo F here with reported facial droop + left sided arm numbness/tingling. Suspect complex migraine. Facial droop seems volitional and no obvious neuro deficits on my exam. Pt was activated as code stroke PTA. CT head neg. Labs, vitals reassuring. Teleneuro agrees with likely complex migraine - recommends meds, d/c if improving.  HA improved after meds. Repeat neuro exam is unremarkable. D/c with outpt follow-up.  ____________________________________________  FINAL CLINICAL IMPRESSION(S) / ED DIAGNOSES  Final diagnoses:  Complicated migraine     MEDICATIONS GIVEN DURING THIS VISIT:  Medications  sodium chloride flush (NS) 0.9 % injection 3 mL (3 mLs Intravenous Not Given 08/30/19 1839)  prochlorperazine (COMPAZINE) injection 10 mg (10 mg Intravenous Given 08/30/19 1834)  diphenhydrAMINE (BENADRYL) injection 25 mg (25 mg Intravenous Given 08/30/19 1835)  ketorolac (TORADOL) 30 MG/ML injection 15 mg (15 mg Intravenous Given 08/30/19 1831)  dexamethasone (DECADRON) injection 10 mg (10 mg Intravenous Given 08/30/19 1831)  sodium chloride 0.9 % bolus 1,000 mL (1,000 mLs Intravenous New Bag/Given 08/30/19 1837)     ED Discharge Orders         Ordered     butalbital-acetaminophen-caffeine (FIORICET) 50-325-40 MG tablet  Every 6 hours PRN     08/30/19 1912           Note:  This document was prepared using Dragon voice recognition software and may include unintentional dictation errors.   Duffy Bruce, MD 08/30/19 [phone]

## 2019-08-30 NOTE — Consult Note (Signed)
[facility] Tele[facility]urology Consult Services   Date of Service:   08/30/2019 17:01:33  Impression:     .  complicated migraine  Comments/Sign-Out: the patient does not have any focal deficit on neurologic examination. She has a normal facial expression with no droop and symmetric nasolabial folds. There is involuntary pulling of the left mouth at times which is intermittent and distractible. There is no objective numbness. With the transient vision issue or history of migraines with tingling in the past and now a headache with some tingling as well in this young woman with lack of vascular risk factors strongly suspect this is complicated migraine. Her CT of the head is very reassuring. If her neurologic symptoms improve with treatment of the headache she can follow up with her primary care provider. If her symptoms worsen in some way she may need additional neurologic workup.  Metrics: Last Known Well: 08/30/2019 14:45:00 [facility] Notification Time: 08/30/2019 17:01:33 Arrival Time: 08/30/2019 16:36:00 Stamp Time: 08/30/2019 17:01:33 Time First Login Attempt: 08/30/2019 17:05:00 Video Start Time: 08/30/2019 17:05:00  Symptoms: tingling slurred speech. NIHSS Start Assessment Time: 08/30/2019 17:00:00 Patient is not a candidate for Alteplase/Activase. Patient was not deemed candidate for Alteplase/Activase thrombolytics because of exam and history are much more consistent with complicated migraine and stroke. Video End Time: 08/30/2019 17:24:23  CT head showed no acute hemorrhage or acute core infarct.  Clinical Presentation is not Suggestive of Large Vessel Occlusive Disease  Radiologist was not called back for review of advanced imaging because na ED Physician notified of diagnostic impression and management plan on 08/30/2019 17:27:24  Sign Out:     .  Discussed with Emergency Department  Provider    ------------------------------------------------------------------------------  History of Present Illness: Patient is a 38 year old Female.  Patient was brought by private transportation with symptoms of tingling slurred speech.  Seh was feeling really stressed then developed left sided tingling. Has a hx of seizures no othe rissues known ALso says she had somje BV in her left eye with a red line through it. Shes had tingling in her face in the past with prior headaches just not to this extent. Shes had HA with N and sensitivity to light and sound. She had a HA earlier today when this started now improved not resolved. she rates it at about a 3 out of 10 in severity. She does not smoke and has no other vascular risk factors   Past Medical History:     . There is NO history of Hypertension     . There is NO history of Hyperlipidemia     . There is NO history of Coronary Artery Disease     . There is NO history of Stroke  Anticoagulant use:  No  Antiplatelet use: No  Examination: BP(118/77), Pulse(75), Blood Glucose(111) 1A: Level of Consciousness - Alert; keenly responsive + 0 1B: Ask Month and Age - Both Questions Right + 0 1C: Blink Eyes & Squeeze Hands - Performs Both Tasks + 0 2: Test Horizontal Extraocular Movements - Normal + 0 3: Test Visual Fields - No Visual Loss + 0 4: Test Facial Palsy (Use Grimace if Obtunded) - Normal symmetry + 0 5A: Test Left Arm Motor Drift - No Drift for 10 Seconds + 0 5B: Test Right Arm Motor Drift - No Drift for 10 Seconds + 0 6A: Test Left Leg Motor Drift - No Drift for 5 Seconds + 0 6B: Test Right Leg Motor Drift - No Drift for 5 Seconds +  0 7: Test Limb Ataxia (FNF/Heel-Shin) - No Ataxia + 0 8: Test Sensation - Normal; No sensory loss + 0 9: Test Language/Aphasia - Normal; No aphasia + 0 10: Test Dysarthria - Normal + 0 11: Test Extinction/Inattention - No abnormality + 0  NIHSS Score: 0  Pre-Morbid Modified Ranking  Scale: 0 Points = No symptoms at all   Patient/Family was informed the Neurology Consult would happen via TeleHealth consult by way of interactive audio and video telecommunications and consented to receiving care in this manner.   Due to the immediate potential for life-threatening deterioration due to underlying acute neurologic illness, I spent 35 minutes providing critical care. This time includes time for face to face visit via telemedicine, review of medical records, imaging studies and discussion of findings with providers, the patient and/or family.   Dr Jossie Ng   [facility] [phone]   Case [id number]

## 2019-12-12 ENCOUNTER — Other Ambulatory Visit: Payer: Self-pay | Admitting: Orthopedic Surgery

## 2019-12-12 DIAGNOSIS — M76892 Other specified enthesopathies of left lower limb, excluding foot: Secondary | ICD-10-CM

## 2019-12-21 ENCOUNTER — Other Ambulatory Visit: Payer: Self-pay | Admitting: Orthopedic Surgery

## 2019-12-21 DIAGNOSIS — M76892 Other specified enthesopathies of left lower limb, excluding foot: Secondary | ICD-10-CM

## 2019-12-25 ENCOUNTER — Other Ambulatory Visit: Payer: Self-pay

## 2019-12-25 ENCOUNTER — Ambulatory Visit
Admission: RE | Admit: 2019-12-25 | Discharge: 2019-12-25 | Disposition: A | Discharge status: Home / Self Care | Payer: 59 | Source: Ambulatory Visit | Attending: Orthopedic Surgery | Admitting: Orthopedic Surgery

## 2019-12-25 ENCOUNTER — Ambulatory Visit: Admission: RE | Admit: 2019-12-25 | Payer: 59 | Source: Ambulatory Visit

## 2019-12-25 DIAGNOSIS — M76892 Other specified enthesopathies of left lower limb, excluding foot: Secondary | ICD-10-CM | POA: Diagnosis present

## 2019-12-25 DIAGNOSIS — S73192A Other sprain of left hip, initial encounter: Secondary | ICD-10-CM | POA: Diagnosis not present

## 2019-12-25 DIAGNOSIS — K5730 Diverticulosis of large intestine without perforation or abscess without bleeding: Secondary | ICD-10-CM | POA: Diagnosis not present

## 2019-12-25 DIAGNOSIS — X58XXXA Exposure to other specified factors, initial encounter: Secondary | ICD-10-CM | POA: Diagnosis not present

## 2019-12-25 MED ORDER — IOHEXOL 180 MG/ML  SOLN
25.0000 mL | Freq: Once | INTRAMUSCULAR | Status: AC | PRN
  Administered 2019-12-25: 25 mL

## 2019-12-25 MED ORDER — SODIUM CHLORIDE (PF) 0.9 % IJ SOLN
5.0000 mL | Freq: Once | INTRAMUSCULAR | Status: AC
  Administered 2019-12-25: 5 mL via INTRAVENOUS

## 2019-12-25 MED ORDER — GADOBUTROL 1 MMOL/ML IV SOLN
0.0500 mL | Freq: Once | INTRAVENOUS | Status: AC | PRN
  Administered 2019-12-25: 0.05 mL

## 2019-12-25 MED ORDER — LIDOCAINE HCL (PF) 1 % IJ SOLN
10.0000 mL | Freq: Once | INTRAMUSCULAR | Status: AC
  Administered 2019-12-25: 10 mL
  Filled 2019-12-25: qty 10

## 2020-06-14 ENCOUNTER — Other Ambulatory Visit: Payer: Self-pay

## 2020-06-14 ENCOUNTER — Emergency Department
Admission: EM | Admit: 2020-06-14 | Discharge: 2020-06-14 | Disposition: A | Discharge status: Home / Self Care | Payer: 59 | Attending: Emergency Medicine | Admitting: Emergency Medicine

## 2020-06-14 ENCOUNTER — Emergency Department: Payer: 59

## 2020-06-14 DIAGNOSIS — R0789 Other chest pain: Secondary | ICD-10-CM | POA: Insufficient documentation

## 2020-06-14 DIAGNOSIS — M79602 Pain in left arm: Secondary | ICD-10-CM | POA: Insufficient documentation

## 2020-06-14 DIAGNOSIS — Z5321 Procedure and treatment not carried out due to patient leaving prior to being seen by health care provider: Secondary | ICD-10-CM | POA: Insufficient documentation

## 2020-06-14 LAB — COMPREHENSIVE METABOLIC PANEL
ALT: 15 U/L (ref 0–44)
AST: 16 U/L (ref 15–41)
Albumin: 3.6 g/dL (ref 3.5–5.0)
Alkaline Phosphatase: 63 U/L (ref 38–126)
Anion gap: 5 (ref 5–15)
BUN: 11 mg/dL (ref 6–20)
CO2: 23 mmol/L (ref 22–32)
Calcium: 9.1 mg/dL (ref 8.9–10.3)
Chloride: 108 mmol/L (ref 98–111)
Creatinine, Ser: 0.76 mg/dL (ref 0.44–1.00)
GFR calc Af Amer: >60 mL/min (ref >60)
GFR calc non Af Amer: >60 mL/min (ref >60)
Glucose, Bld: 109 mg/dL — ABNORMAL HIGH (ref 70–99)
Potassium: 3.6 mmol/L (ref 3.5–5.1)
Sodium: 136 mmol/L (ref 135–145)
Total Bilirubin: 0.6 mg/dL (ref 0.3–1.2)
Total Protein: 7.3 g/dL (ref 6.5–8.1)

## 2020-06-14 LAB — CBC
HCT: 37.9 % (ref 36.0–46.0)
Hemoglobin: 12.4 g/dL (ref 12.0–15.0)
MCH: 27.3 pg (ref 26.0–34.0)
MCHC: 32.7 g/dL (ref 30.0–36.0)
MCV: 83.5 fL (ref 80.0–100.0)
Platelets: 279 10*3/uL (ref 150–400)
RBC: 4.54 MIL/uL (ref 3.87–5.11)
RDW: 13.1 % (ref 11.5–15.5)
WBC: 7.3 10*3/uL (ref 4.0–10.5)
nRBC: 0 % (ref 0.0–0.2)

## 2020-06-14 LAB — TROPONIN I (HIGH SENSITIVITY): Troponin I (High Sensitivity): <2 ng/L (ref <18)

## 2020-06-14 NOTE — ED Notes (Signed)
Called for repeat vital signs and updated lab. No answer. Pt not seen in the lobby or outside

## 2020-06-14 NOTE — ED Notes (Signed)
Called for repeat vital signs and labs, no answer, pt not visualized in lobby or outside

## 2020-06-14 NOTE — ED Triage Notes (Signed)
Pt states her left arm was hurting for 2 days and then this morning her chest felt tight- pt states her bp has been elevated as well

## 2020-06-14 NOTE — ED Notes (Signed)
Called for repeat labs and vital signs x 1, no answer, not in lobby or outside

## 2021-08-30 IMAGING — CT CT HEAD CODE STROKE
3 series · 15 of 44 positions shown, 18 images · non-contrast
Comparison: None.

CLINICAL DATA: Code stroke.  Left-sided facial

EXAM:
CT HEAD WITHOUT CONTRAST
TECHNIQUE: Contiguous axial images were obtained from the base of the skull
through the vertex without intravenous contrast.

[Series 3: head wo · axial · 0.42mm/px · z∈[-138,-28]mm · 9 of 27 slices shown, 12 images]
[im 3/27  brain]
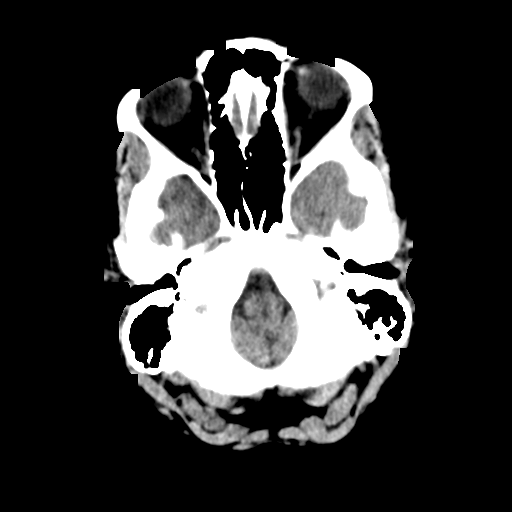
[im 3/27  bone]
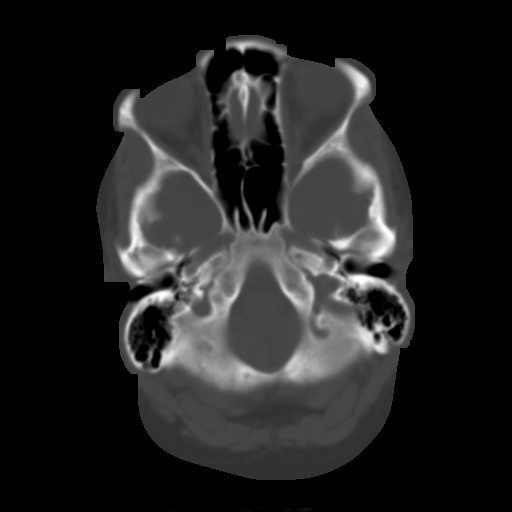
[im 6/27  brain]
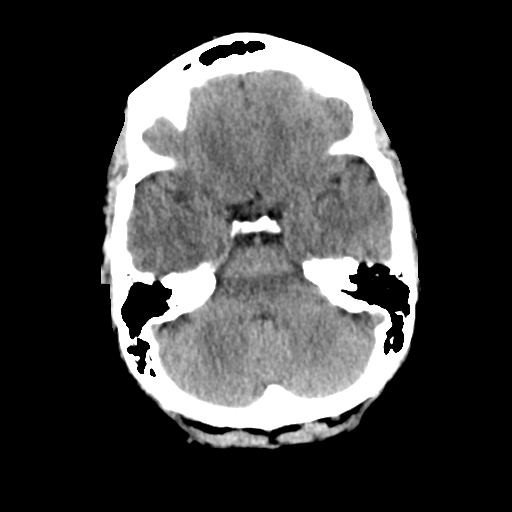
[im 8/27  brain]
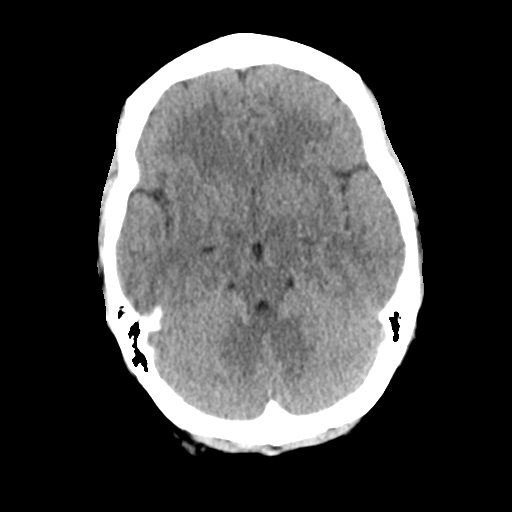
[im 11/27  brain]
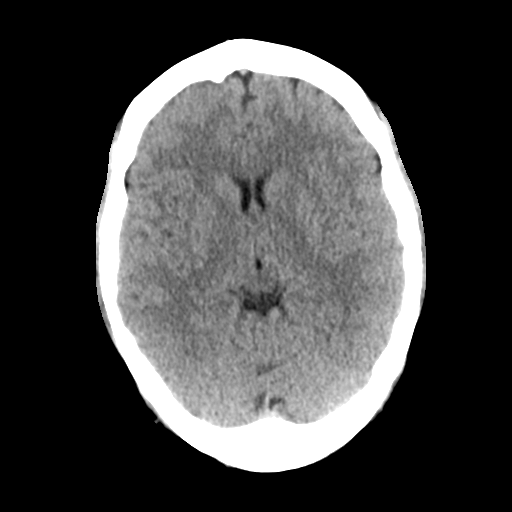
[im 14/27  brain]
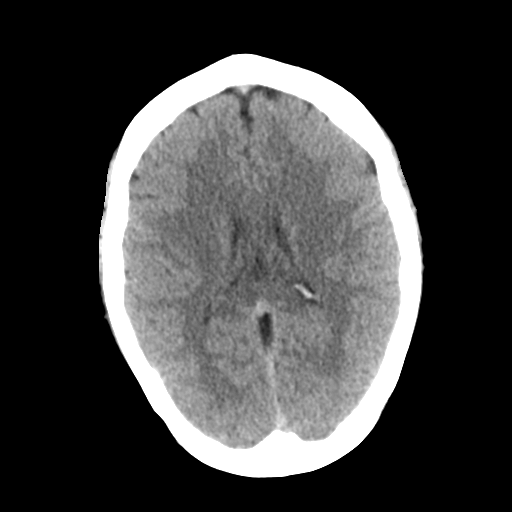
[im 14/27  bone]
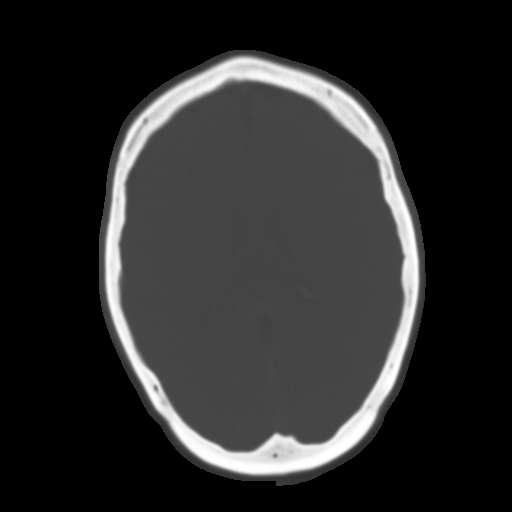
[im 17/27  brain]
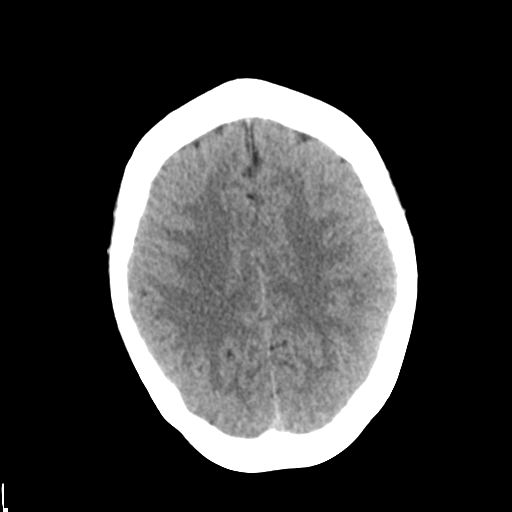
[im 20/27  brain]
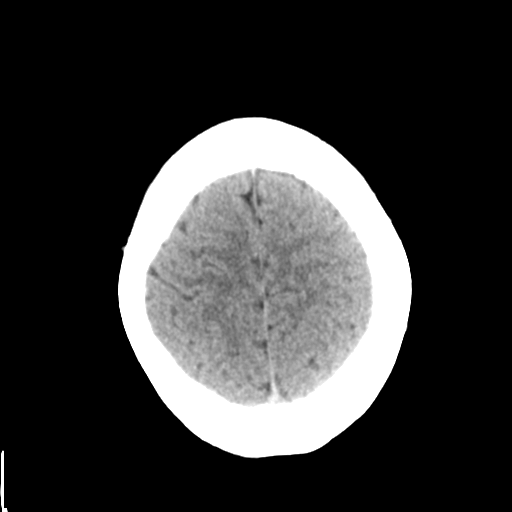
[im 22/27  brain]
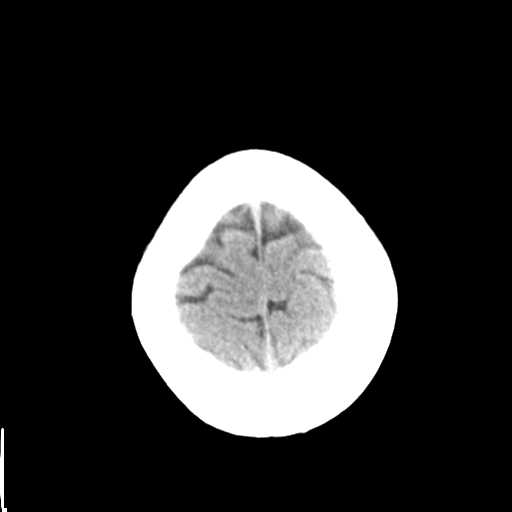
[im 25/27  brain]
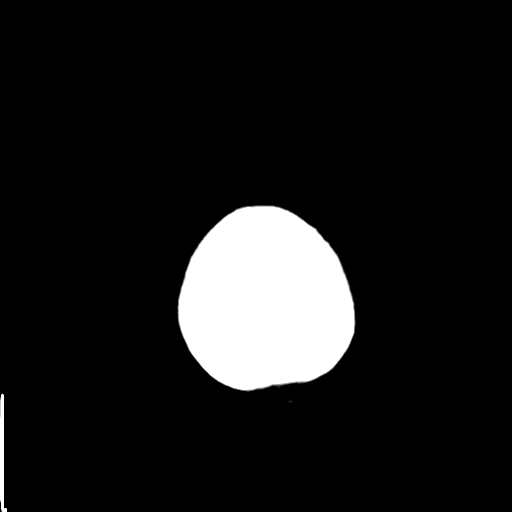
[im 25/27  bone]
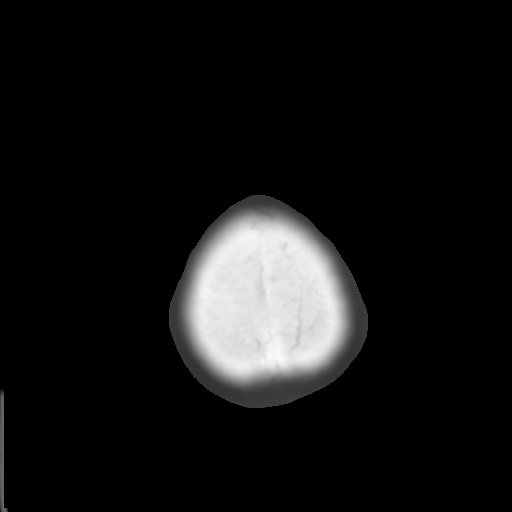

[Series 4: coronal soft tissue · coronal · 0.32mm/px · 3 of 65 slices shown]
[im 22/65  brain]
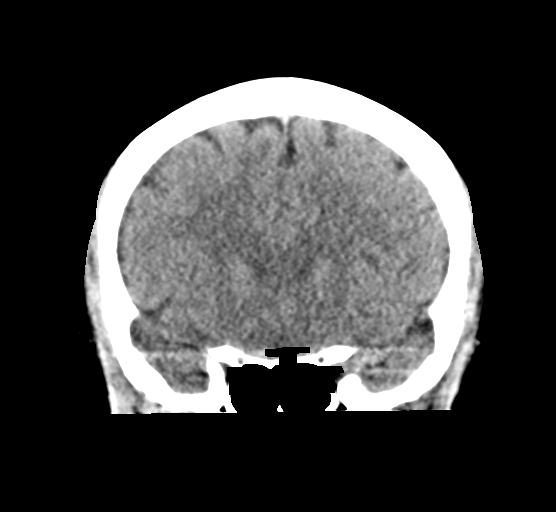
[im 29/65  brain]
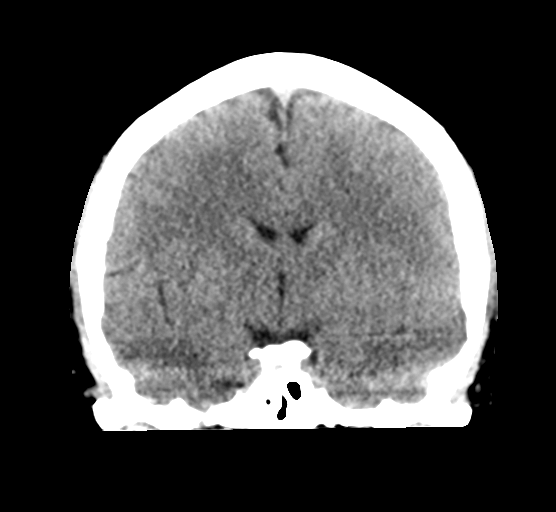
[im 36/65  brain]
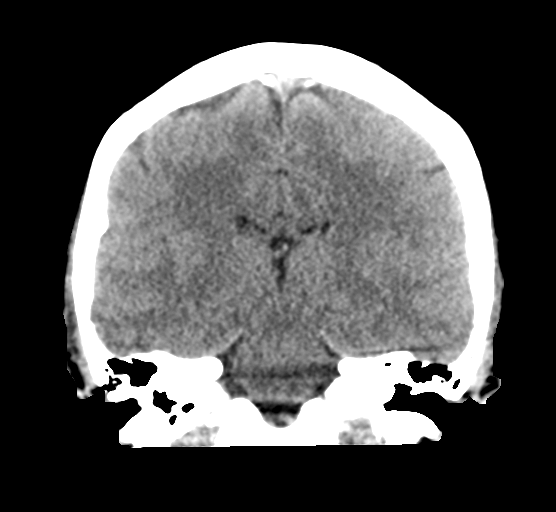

[Series 5: sagittal soft tissue · sagittal · 0.32mm/px · 3 of 51 slices shown]
[im 17/51  brain]
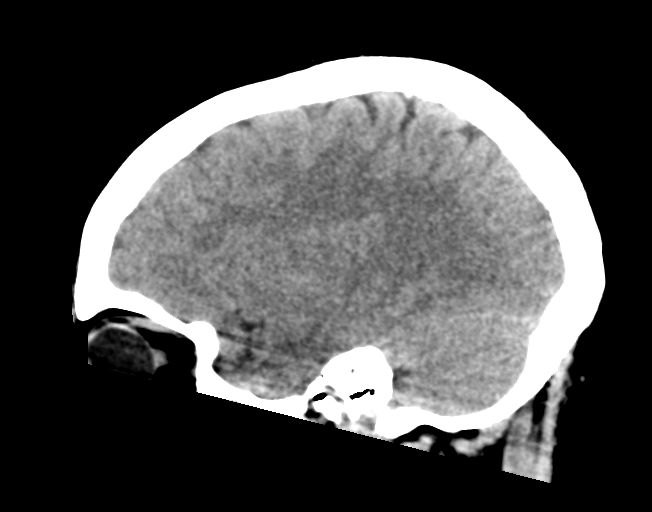
[im 26/51  brain]
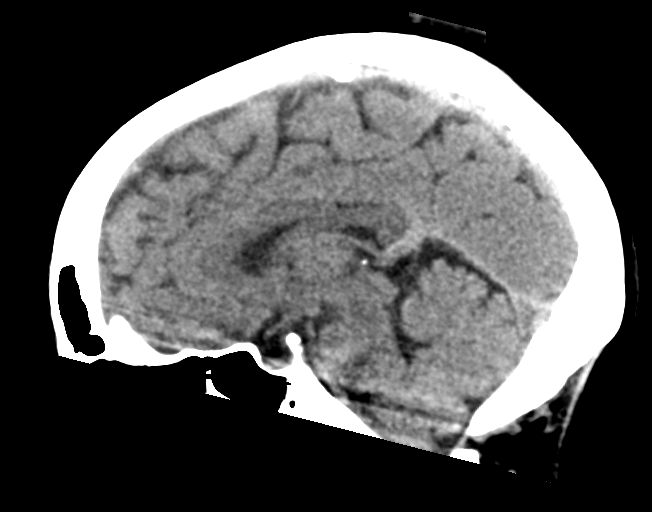
[im 34/51  brain]
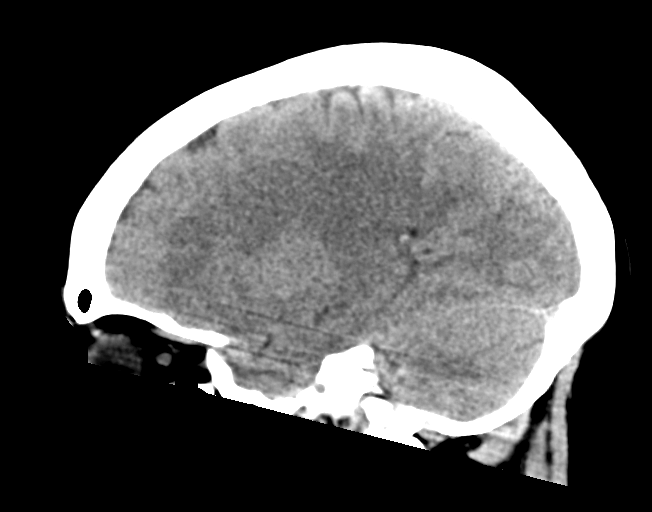

[15 of 44 positions shown; findings below may reference images not displayed]

FINDINGS: Brain: There is no acute intracranial hemorrhage, mass-effect, or
edema. Gray-white differentiation is preserved. There is no
extra-axial fluid collection. Ventricles and sulci are within normal
limits in size and configuration.

Vascular: No hyperdense vessel.

Skull: Calvarium is unremarkable.

Sinuses/Orbits: No acute finding.

Other: None.

ASPECTS ([place] Stroke Program Early CT Score)

- Ganglionic level infarction (caudate, lentiform nuclei, internal
capsule, insula, M1-M3 cortex): 7

- Supraganglionic infarction (M4-M6 cortex): 3

Total score (0-10 with 10 being normal): 10
IMPRESSION: 1. No acute intracranial hemorrhage or evidence of acute infarction.
2. ASPECTS is 10

These results were communicated to Dr. Guy Alain at [DATE] pmon
08/30/2019

## 2022-02-17 ENCOUNTER — Emergency Department: Payer: Medicaid Other

## 2022-02-17 ENCOUNTER — Other Ambulatory Visit: Payer: Self-pay

## 2022-02-17 ENCOUNTER — Emergency Department
Admission: EM | Admit: 2022-02-17 | Discharge: 2022-02-17 | Disposition: A | Discharge status: Home / Self Care | Payer: Medicaid Other | Attending: Emergency Medicine | Admitting: Emergency Medicine

## 2022-02-17 ENCOUNTER — Encounter: Payer: Self-pay | Admitting: Emergency Medicine

## 2022-02-17 DIAGNOSIS — R0789 Other chest pain: Secondary | ICD-10-CM | POA: Insufficient documentation

## 2022-02-17 LAB — BASIC METABOLIC PANEL
Anion gap: 7 (ref 5–15)
BUN: 11 mg/dL (ref 6–20)
CO2: 26 mmol/L (ref 22–32)
Calcium: 9.1 mg/dL (ref 8.9–10.3)
Chloride: 104 mmol/L (ref 98–111)
Creatinine, Ser: 0.71 mg/dL (ref 0.44–1.00)
GFR, Estimated: >60 mL/min (ref >60)
Glucose, Bld: 108 mg/dL — ABNORMAL HIGH (ref 70–99)
Potassium: 3.8 mmol/L (ref 3.5–5.1)
Sodium: 137 mmol/L (ref 135–145)

## 2022-02-17 LAB — CBC
HCT: 40.9 % (ref 36.0–46.0)
Hemoglobin: 13.1 g/dL (ref 12.0–15.0)
MCH: 27.4 pg (ref 26.0–34.0)
MCHC: 32 g/dL (ref 30.0–36.0)
MCV: 85.6 fL (ref 80.0–100.0)
Platelets: 296 10*3/uL (ref 150–400)
RBC: 4.78 MIL/uL (ref 3.87–5.11)
RDW: 12.6 % (ref 11.5–15.5)
WBC: 7.7 10*3/uL (ref 4.0–10.5)
nRBC: 0 % (ref 0.0–0.2)

## 2022-02-17 LAB — TROPONIN I (HIGH SENSITIVITY): Troponin I (High Sensitivity): 2 ng/L (ref <18)

## 2022-02-17 LAB — POC URINE PREG, ED: Preg Test, Ur: NEGATIVE

## 2022-02-17 MED ORDER — BUTALBITAL-APAP-CAFFEINE 50-325-40 MG PO TABS: 1.0000 | ORAL_TABLET | Freq: Four times a day (QID) | ORAL | 0 refills | Status: AC | PRN

## 2022-02-17 MED ORDER — LIDOCAINE VISCOUS HCL 2 % MT SOLN
15.0000 mL | Freq: Once | OROMUCOSAL | Status: AC
Start: 2022-02-17 — End: 2022-02-17
  Administered 2022-02-17: 15 mL via ORAL
  Filled 2022-02-17: qty 15

## 2022-02-17 MED ORDER — PANTOPRAZOLE SODIUM 40 MG PO TBEC: 40.0000 mg | DELAYED_RELEASE_TABLET | Freq: Every day | ORAL | 1 refills | Status: AC

## 2022-02-17 MED ORDER — ALUM & MAG HYDROXIDE-SIMETH 200-200-20 MG/5ML PO SUSP
30.0000 mL | Freq: Once | ORAL | Status: AC
  Administered 2022-02-17: 30 mL via ORAL
  Filled 2022-02-17: qty 30

## 2022-02-17 NOTE — ED Notes (Signed)
Dr. Cyril Loosen states pt. Does not need repeat troponin. ?

## 2022-02-17 NOTE — ED Provider Notes (Signed)
? ?[facility] ?Provider Note ? ? ? Event Date/Time  ? First MD Initiated Contact with Patient 02/17/22 [phone]   ?  (approximate) ? ? ?History  ? ?Chest Pain ? ? ?HPI ? ?Kim Craig is a 41 y.o. female with a family history of cardiac disease, a history of GERD who presents with complaints of chest discomfort.  Patient reports for the last 3 days she has been having intermittent discomfort in the center of her chest.  She wonders whether this may be related to stress as her father died 1 month ago.  She reports that she has been stress eating and has gained 20 pounds as well.  She denies shortness of breath.  No pleurisy.  No nausea vomiting or diaphoresis.  She does not smoke. ?  ? ? ?Physical Exam  ? ?Triage Vital Signs: ?ED Triage Vitals  ?Enc Vitals Group  ?   BP 02/17/22 0829 126/87  ?   Pulse Rate 02/17/22 0829 80  ?   Resp 02/17/22 0829 18  ?   Temp 02/17/22 0829 98.8 ?F (37.1 ?C)  ?   Temp Source 02/17/22 0829 Oral  ?   SpO2 02/17/22 0829 94 %  ?   Weight 02/17/22 0825 108.9 kg (240 lb 1.3 oz)  ?   Height 02/17/22 0825 1.626 m (5\' 4" )  ?   Head Circumference --   ?   Peak Flow --   ?   Pain Score 02/17/22 0825 6  ?   Pain Loc --   ?   Pain Edu? --   ?   Excl. in [place]? --   ? ? ?Most recent vital signs: ?Vitals:  ? 02/17/22 0930 02/17/22 0945  ?BP:    ?Pulse: 72 73  ?Resp: (!) 21 11  ?Temp:    ?SpO2:    ? ? ? ?General: Awake, no distress.  ?CV:  Good peripheral perfusion.  Regular rate and rhythm ?Resp:  Normal effort.  CTA bilateral ?Abd:  No distention.  ?Other:  No calf pain or swelling ? ? ?ED Results / Procedures / Treatments  ? ?Labs ?(all labs ordered are listed, but only abnormal results are displayed) ?Labs Reviewed  ?BASIC METABOLIC PANEL - Abnormal; Notable for the following components:  ?    Result Value  ? Glucose, Bld 108 (*)   ? All other components within normal limits  ?CBC  ?POC URINE PREG, ED  ?TROPONIN I (HIGH SENSITIVITY)  ? ? ? ?EKG ? ?ED ECG REPORT ?I, 04/19/22,  the attending physician, personally viewed and interpreted this ECG. ? ?Date: 02/17/2022 ? ?Rhythm: normal sinus rhythm ?QRS Axis: normal ?Intervals: normal ?ST/T Wave abnormalities: normal ?Narrative Interpretation: no evidence of acute ischemia ? ? ? ?RADIOLOGY ?Chest x-ray viewed interpret by me, no acute abnormality ? ? ? ?PROCEDURES: ? ?Critical Care performed:  ? ?Procedures ? ? ?MEDICATIONS ORDERED IN ED: ?Medications  ?alum & mag hydroxide-simeth (MAALOX/MYLANTA) 200-200-20 MG/5ML suspension 30 mL (30 mLs Oral Given 02/17/22 0953)  ?  And  ?lidocaine (XYLOCAINE) 2 % viscous mouth solution 15 mL (15 mLs Oral Given 02/17/22 0953)  ? ? ? ?IMPRESSION / MDM / ASSESSMENT AND PLAN / ED COURSE  ?I reviewed the triage vital signs and the nursing notes. ? ?Patient is here for chest pain which is an acute potentially life-threatening illness ? ?On exam, patient well-appearing and in no acute distress. ? ?Her EKG is reassuring, doubt ACS, high sensitive troponin is normal ? ?  Question whether stress may be causing chest discomfort and/or worsening of GERD given increased p.o. intake, stress.  She does admit to significant heartburn issues. ? ?Lab work is reviewed and overall quite reassuring, normal CBC, normal BMP.  Chest x-ray without evidence of pneumonia, edema, pneumothorax ? ?We will trial GI cocktail as a possible esophageal source of her discomfort. ? ?Reevaluated patient, she remains comfortable and well-appearing in no acute distress.  Considered admission however she would like discharge with outpatient follow-up.  She feels this is stress related.  Return precautions discussed, outpatient follow-up with cardiology arranged. ? ? ? ?  ? ? ?FINAL CLINICAL IMPRESSION(S) / ED DIAGNOSES  ? ?Final diagnoses:  ?Atypical chest pain  ? ? ? ?Rx / DC Orders  ? ?ED Discharge Orders   ? ?      Ordered  ?  Ambulatory referral to Cardiology       ?Comments: If you have not heard from the Cardiology office within the next 72  hours please call [phone].  ? 02/17/22 1019  ?  pantoprazole (PROTONIX) 40 MG tablet  Daily       ? 02/17/22 1020  ?  butalbital-acetaminophen-caffeine (FIORICET) 50-325-40 MG tablet  Every 6 hours PRN       ? 02/17/22 1021  ? ?  ?  ? ?  ? ? ? ?Note:  This document was prepared using Dragon voice recognition software and may include unintentional dictation errors. ?  ?Jene Every, MD ?02/17/22 1050 ? ?

## 2022-02-17 NOTE — ED Triage Notes (Signed)
C/O chest tightness, headache, cough x 3 days.  Denies fever.   Also c/o SOB.  Denies aggravating or alleviating factors. ? ?AAOx3.  Skin warm and dry. NAD  No SOB/ DOE ?

## 2022-02-17 NOTE — ED Notes (Signed)
Pt. Up to toilet to obtain urine specimen. Gait steady, NAD. ?

## 2022-03-25 ENCOUNTER — Ambulatory Visit: Payer: Medicaid Other | Admitting: Cardiology

## 2022-05-03 ENCOUNTER — Encounter: Payer: Self-pay | Admitting: Cardiology

## 2022-05-03 ENCOUNTER — Ambulatory Visit (INDEPENDENT_AMBULATORY_CARE_PROVIDER_SITE_OTHER): Payer: Medicaid Other | Admitting: Cardiology

## 2022-05-03 VITALS — BP 122/84 | HR 77 | Ht 64.0 in | Wt 251.6 lb

## 2022-05-03 DIAGNOSIS — R072 Precordial pain: Secondary | ICD-10-CM | POA: Diagnosis not present

## 2022-05-03 DIAGNOSIS — K2100 Gastro-esophageal reflux disease with esophagitis, without bleeding: Secondary | ICD-10-CM

## 2022-05-03 NOTE — Progress Notes (Signed)
Cardiology Office Note:    Date:  05/03/2022   ID:  Kim Craig, DOB 25-Dec-1980, MRN [id number]  PCP:  Ricardo Jericho, NP   [place] Providers Cardiologist:  None     Referring MD: Lavonia Drafts, MD   Chief Complaint  Patient presents with   Chest Pain    Patient states that she is feeling fine today. Meds reviewed with patient.    Kim Craig is a 41 y.o. female who is being seen today for the evaluation of chest pain at the request of Lavonia Drafts, MD.   History of Present Illness:    Kim Craig is a 41 y.o. female with a hx of GERD, obesity who presents due to chest pain.  Patient had symptoms of chest pain 6 weeks ago prompting her to go to the emergency room.  Symptoms of chest pain are not associated with exertion or reproducible with palpation.  Chest pain was different from her typical heartburn/reflux pain.  Chest pain symptoms lasted all day.  Work-up in the ED was unrevealing, patient states symptoms resolved hours later while at home.  She has been doing okay since then, unsure if stress is causing her symptoms.  Has had 2 other episodes of chest discomfort over the past 2 years.  She lost her father a couple of months ago due to heart attack, father was a smoker.  Past Medical History:  Diagnosis Date   Anemia    h/o   Complication of anesthesia    HAD SEIZURE AFTER D&E X 1   Cough 11/08/2017   GERD (gastroesophageal reflux disease)    occ    Motion sickness    cars, boats, planes   Seizures ([place])    x1 - over 15 yrs ago, after miscarriage   Wears contact lenses     Past Surgical History:  Procedure Laterality Date   DILATION AND CURETTAGE OF UTERUS     LAPAROSCOPIC VAGINAL HYSTERECTOMY WITH SALPINGO OOPHORECTOMY Bilateral 04/23/2019   Procedure: LAPAROSCOPIC ASSISTED VAGINAL HYSTERECTOMY WITH LEFT SALPINGO OOPHORECTOMY AND RIGHT SALPINGECTOMY;  Surgeon: Boykin Nearing, MD;  Location: [facility];  Service: Gynecology;   Laterality: Bilateral;   NO PAST SURGERIES     SHOULDER ARTHROSCOPY Left 11/15/2017   Procedure: OPEN DISTAL CLAVICLE EXCISION;  Surgeon: Leim Fabry, MD;  Location: [place];  Service: Orthopedics;  Laterality: Left;  MAC/ REG WITH INTERSCALENE BLOCK BEACH CHAIR WITH SPYDER SIMPLY SLING   UTERINE FIBROID EMBOLIZATION      Current Medications: Current Meds  Medication Sig   pantoprazole (PROTONIX) 40 MG tablet Take 1 tablet (40 mg total) by mouth daily.   valACYclovir (VALTREX) 500 MG tablet Take 500 mg by mouth as needed.     Allergies:   Metronidazole   Social History   Socioeconomic History   Marital status: Single    Spouse name: Not on file   Number of children: Not on file   Years of education: Not on file   Highest education level: Not on file  Occupational History   Not on file  Tobacco Use   Smoking status: Never   Smokeless tobacco: Never  Vaping Use   Vaping Use: Never used  Substance and Sexual Activity   Alcohol use: Yes    Alcohol/week: 2.0 standard drinks of alcohol    Types: 2 Glasses of wine per week    Comment: socially   Drug use: No   Sexual activity: Not on file  Other Topics Concern   Not on file  Social History Narrative   Not on file   Social Determinants of Health   Financial Resource Strain: Not on file  Food Insecurity: Not on file  Transportation Needs: Not on file  Physical Activity: Not on file  Stress: Not on file  Social Connections: Not on file     Family History: The patient's family history includes Diabetes in her mother; Heart failure in her father and mother.  ROS:   Please see the history of present illness.     All other systems reviewed and are negative.  EKGs/Labs/Other Studies Reviewed:    The following studies were reviewed today:   EKG:  EKG is  ordered today.  The ekg ordered today demonstrates normal sinus rhythm, normal ECG  Recent Labs: 02/17/2022: BUN 11; Creatinine, Ser 0.71; Hemoglobin  13.1; Platelets 296; Potassium 3.8; Sodium 137  Recent Lipid Panel No results found for: "CHOL", "TRIG", "HDL", "CHOLHDL", "VLDL", "LDLCALC", "LDLDIRECT"   Risk Assessment/Calculations:        Physical Exam:    VS:  BP 122/84 (BP Location: Right Arm, Patient Position: Sitting, Cuff Size: Large)   Pulse 77   Ht _0  (1.626 m)   Wt 251 lb 9.6 oz (114.1 kg)   LMP 04/06/2019 (Approximate)   SpO2 98%   BMI 43.19 kg/m     Wt Readings from Last 3 Encounters:  05/03/22 251 lb 9.6 oz (114.1 kg)  02/17/22 240 lb 1.3 oz (108.9 kg)  08/30/19 230 lb (104.3 kg)     GEN:  Well nourished, well developed in no acute distress HEENT: Normal NECK: No JVD; No carotid bruits CARDIAC: RRR, no murmurs, rubs, gallops RESPIRATORY:  Clear to auscultation without rales, wheezing or rhonchi  ABDOMEN: Soft, non-tender, non-distended MUSCULOSKELETAL:  No edema; No deformity  SKIN: Warm and dry NEUROLOGIC:  Alert and oriented x 3 PSYCHIATRIC:  Normal affect   ASSESSMENT:    1. Precordial pain   2. Morbid obesity ([place])   3. Gastroesophageal reflux disease with esophagitis without hemorrhage    PLAN:    In order of problems listed above:  Chest pain, appears atypical.  Get echocardiogram to rule out any structural abnormalities.  Stress relieving factors advised.  If symptoms persist and or echocardiogram shows abnormalities, will plan additional testing with CCTA or Myoview. Obesity, low-calorie diet, weight loss advised. History of reflux, controlled on Protonix.  Follow-up after echocardiogram.      Medication Adjustments/Labs and Tests Ordered: Current medicines are reviewed at length with the patient today.  Concerns regarding medicines are outlined above.  Orders Placed This Encounter  Procedures   EKG 12-Lead   ECHOCARDIOGRAM COMPLETE   No orders of the defined types were placed in this encounter.   Patient Instructions  Medication Instructions:   Your physician recommends  that you continue on your current medications as directed. Please refer to the Current Medication list given to you today.  *If you need a refill on your cardiac medications before your next appointment, please call your pharmacy*   Lab Work: None ordered If you have labs (blood work) drawn today and your tests are completely normal, you will receive your results only by: [place] (if you have MyChart) OR A paper copy in the mail If you have any lab test that is abnormal or we need to change your treatment, we will call you to review the results.   Testing/Procedures:  Your physician has requested that  you have an echocardiogram. Echocardiography is a painless test that uses sound waves to create images of your heart. It provides your doctor with information about the size and shape of your heart and how well your heart's chambers and valves are working. This procedure takes approximately one hour. There are no restrictions for this procedure.    Follow-Up: At [facility], you and your health needs are our priority.  As part of our continuing mission to provide you with exceptional heart care, we have created designated Provider Care Teams.  These Care Teams include your primary Cardiologist (physician) and Advanced Practice Providers (APPs -  Physician Assistants and Nurse Practitioners) who all work together to provide you with the care you need, when you need it.  We recommend signing up for the patient portal called "MyChart".  Sign up information is provided on this After Visit Summary.  MyChart is used to connect with patients for Virtual Visits (Telemedicine).  Patients are able to view lab/test results, encounter notes, upcoming appointments, etc.  Non-urgent messages can be sent to your provider as well.   To learn more about what you can do with MyChart, go to [email or web address].    Your next appointment:   Follow up after testing   The format for your next  appointment:   In Person  Provider:   You may see Kate Sable, MD or one of the following Advanced Practice Providers on your designated Care Team:   Murray Hodgkins, NP Christell Faith, PA-C Cadence Kathlen Mody, [place]    Other Instructions   Important Information About Sugar         Signed, Kate Sable, MD  05/03/2022 9:49 AM    [place]

## 2022-05-03 NOTE — Patient Instructions (Signed)
Medication Instructions:  ? ?Your physician recommends that you continue on your current medications as directed. Please refer to the Current Medication list given to you today. ? ?*If you need a refill on your cardiac medications before your next appointment, please call your pharmacy* ? ? ?Lab Work: ?None ordered ?If you have labs (blood work) drawn today and your tests are completely normal, you will receive your results only by: ?MyChart Message (if you have MyChart) OR ?A paper copy in the mail ?If you have any lab test that is abnormal or we need to change your treatment, we will call you to review the results. ? ? ?Testing/Procedures: ? ?Your physician has requested that you have an echocardiogram. Echocardiography is a painless test that uses sound waves to create images of your heart. It provides your doctor with information about the size and shape of your heart and how well your heart?s chambers and valves are working. This procedure takes approximately one hour. There are no restrictions for this procedure. ? ? ? ?Follow-Up: ?At [facility], you and your health needs are our priority.  As part of our continuing mission to provide you with exceptional heart care, we have created designated Provider Care Teams.  These Care Teams include your primary Cardiologist (physician) and Advanced Practice Providers (APPs -  Physician Assistants and Nurse Practitioners) who all work together to provide you with the care you need, when you need it. ? ?We recommend signing up for the patient portal called "MyChart".  Sign up information is provided on this After Visit Summary.  MyChart is used to connect with patients for Virtual Visits (Telemedicine).  Patients are able to view lab/test results, encounter notes, upcoming appointments, etc.  Non-urgent messages can be sent to your provider as well.   ?To learn more about what you can do with MyChart, go to [email or web address].   ? ?Your next appointment:    ?Follow up after testing  ? ?The format for your next appointment:   ?In Person ? ?Provider:   ?You may see Brian Agbor-Etang, MD or one of the following Advanced Practice Providers on your designated Care Team:   ?Christopher Berge, NP ?Ryan Dunn, PA-C ?Cadence Furth, PA-C  ? ? ?Other Instructions ? ? ?Important Information About Sugar ? ? ? ? ? ? ?

## 2022-05-27 ENCOUNTER — Ambulatory Visit (INDEPENDENT_AMBULATORY_CARE_PROVIDER_SITE_OTHER): Payer: Medicaid Other

## 2022-05-27 DIAGNOSIS — R072 Precordial pain: Secondary | ICD-10-CM | POA: Diagnosis not present

## 2022-05-27 LAB — ECHOCARDIOGRAM COMPLETE
AR max vel: 2.04 cm2
AV Area VTI: 1.97 cm2
AV Area mean vel: 1.99 cm2
AV Mean grad: 3 mmHg
AV Peak grad: 5.3 mmHg
Ao pk vel: 1.15 m/s
Area-P 1/2: 3.93 cm2
Calc EF: 62.6 %
S' Lateral: 2.7 cm
Single Plane A2C EF: 62.7 %
Single Plane A4C EF: 64.5 %

## 2022-06-09 ENCOUNTER — Ambulatory Visit (INDEPENDENT_AMBULATORY_CARE_PROVIDER_SITE_OTHER): Payer: Medicaid Other | Admitting: Medical

## 2022-06-09 ENCOUNTER — Encounter: Payer: Self-pay | Admitting: Medical

## 2022-06-09 VITALS — BP 124/70 | HR 78 | Ht 64.0 in | Wt 252.0 lb

## 2022-06-09 DIAGNOSIS — R072 Precordial pain: Secondary | ICD-10-CM

## 2022-06-09 DIAGNOSIS — K2100 Gastro-esophageal reflux disease with esophagitis, without bleeding: Secondary | ICD-10-CM

## 2022-06-09 NOTE — Progress Notes (Signed)
Cardiology Office Note:    Date:  06/09/2022   ID:  Kim Craig, DOB June 17, 1981, MRN [id number]  PCP:  Ricardo Jericho, NP  [facility] Cardiologist:  None  [facility] Electrophysiologist:  None   Referring MD: Ricardo Jericho*   Chief Complaint: chest pain follow-up  History of Present Illness:    Kim Craig is a 41 y.o. female with a hx of GERD, obesity who presents for follow-up.   The patient was seen in the ER 02/17/22 for atypical chest pain. Work-up was unrevealing.   Seen in the office 05/03/22 and an echocardiogram was ordered. Echo showed LVEF 60-65%, no WMA, normal diastolic heart failure.   Today, the echo was reviewed in detail. She has occasional chest pain, however she feels this is related to stress. She lost both her parents in the last year from heart disease. She has other life stressors as well. She would like to see therapist, but unable to at this time. She is exercising, with walking at a fast pace throughout the week. She has no chest pain, some SOB. No LLE, orthopnea or pnd.   Past Medical History:  Diagnosis Date   Anemia    h/o   Complication of anesthesia    HAD SEIZURE AFTER D&E X 1   Cough 11/08/2017   GERD (gastroesophageal reflux disease)    occ    Motion sickness    cars, boats, planes   Seizures ([place])    x1 - over 15 yrs ago, after miscarriage   Wears contact lenses     Past Surgical History:  Procedure Laterality Date   DILATION AND CURETTAGE OF UTERUS     LAPAROSCOPIC VAGINAL HYSTERECTOMY WITH SALPINGO OOPHORECTOMY Bilateral 04/23/2019   Procedure: LAPAROSCOPIC ASSISTED VAGINAL HYSTERECTOMY WITH LEFT SALPINGO OOPHORECTOMY AND RIGHT SALPINGECTOMY;  Surgeon: Boykin Nearing, MD;  Location: [facility];  Service: Gynecology;  Laterality: Bilateral;   NO PAST SURGERIES     SHOULDER ARTHROSCOPY Left 11/15/2017   Procedure: OPEN DISTAL CLAVICLE EXCISION;  Surgeon: Leim Fabry, MD;  Location: [place];   Service: Orthopedics;  Laterality: Left;  MAC/ REG WITH INTERSCALENE BLOCK BEACH CHAIR WITH SPYDER SIMPLY SLING   UTERINE FIBROID EMBOLIZATION      Current Medications: Current Meds  Medication Sig   butalbital-acetaminophen-caffeine (FIORICET) 50-325-40 MG tablet Take 1 tablet by mouth every 6 (six) hours as needed for headache.   gabapentin (NEURONTIN) 300 MG capsule Take 1 capsule (300 mg total) by mouth at bedtime.   ipratropium (ATROVENT HFA) 17 MCG/ACT inhaler Inhale 2 puffs into the lungs 3 (three) times daily as needed for wheezing.   pantoprazole (PROTONIX) 40 MG tablet Take 1 tablet (40 mg total) by mouth daily.   valACYclovir (VALTREX) 500 MG tablet Take 500 mg by mouth as needed.     Allergies:   Metronidazole   Social History   Socioeconomic History   Marital status: Single    Spouse name: Not on file   Number of children: Not on file   Years of education: Not on file   Highest education level: Not on file  Occupational History   Not on file  Tobacco Use   Smoking status: Never   Smokeless tobacco: Never  Vaping Use   Vaping Use: Never used  Substance and Sexual Activity   Alcohol use: Yes    Alcohol/week: 2.0 standard drinks of alcohol    Types: 2 Glasses of wine per week    Comment:  socially   Drug use: No   Sexual activity: Not on file  Other Topics Concern   Not on file  Social History Narrative   Not on file   Social Determinants of Health   Financial Resource Strain: Not on file  Food Insecurity: Not on file  Transportation Needs: Not on file  Physical Activity: Not on file  Stress: Not on file  Social Connections: Not on file     Family History: The patient's family history includes Diabetes in her mother; Heart failure in her father and mother.  ROS:   Please see the history of present illness.     All other systems reviewed and are negative.  EKGs/Labs/Other Studies Reviewed:    The following studies were reviewed today:  Echo  05/2022  1. Left ventricular ejection fraction, by estimation, is 60 to 65%. Left  ventricular ejection fraction by 2D MOD biplane is 62.6 %. The left  ventricle has normal function. The left ventricle has no regional wall  motion abnormalities. Left ventricular  diastolic parameters were normal.   2. Right ventricular systolic function is normal. The right ventricular  size is normal.   3. The mitral valve is normal in structure. No evidence of mitral valve  regurgitation.   4. The aortic valve is tricuspid. Aortic valve regurgitation is not  visualized.   5. The inferior vena cava is normal in size with greater than 50%  respiratory variability, suggesting right atrial pressure of 3 mmHg.   EKG:  EKG is not ordered today.    Recent Labs: 02/17/2022: BUN 11; Creatinine, Ser 0.71; Hemoglobin 13.1; Platelets 296; Potassium 3.8; Sodium 137  Recent Lipid Panel No results found for: "CHOL", "TRIG", "HDL", "CHOLHDL", "VLDL", "LDLCALC", "LDLDIRECT"   Physical Exam:    VS:  BP 124/70 (BP Location: Left Arm, Patient Position: Sitting, Cuff Size: Normal)   Pulse 78   Ht _0  (1.626 m)   Wt 252 lb (114.3 kg)   LMP 04/06/2019 (Approximate)   SpO2 98%   BMI 43.26 kg/m     Wt Readings from Last 3 Encounters:  06/09/22 252 lb (114.3 kg)  05/03/22 251 lb 9.6 oz (114.1 kg)  02/17/22 240 lb 1.3 oz (108.9 kg)     GEN:  Well nourished, well developed in no acute distress HEENT: Normal NECK: No JVD; No carotid bruits LYMPHATICS: No lymphadenopathy CARDIAC: RRR, no murmurs, rubs, gallops RESPIRATORY:  Clear to auscultation without rales, wheezing or rhonchi  ABDOMEN: Soft, non-tender, non-distended MUSCULOSKELETAL:  No edema; No deformity  SKIN: Warm and dry NEUROLOGIC:  Alert and oriented x 3 PSYCHIATRIC:  Normal affect   ASSESSMENT:    1. Precordial pain   2. Morbid obesity ([place])   3. Gastroesophageal reflux disease with esophagitis without hemorrhage    PLAN:    In order of  problems listed above:  Atypical chest pain Echo showed normal LVEF and normal diastolic function with no WMA. Chest pain possibly stress related as patient lost both her parents this year. She is following with PCP as well, who is discussing some type of therapy or other activities to improve stress. She is walking throughout the week. No further work-up at this time.   Obesity Would benefit from weight loss.   GERD Continue PPI.   Disposition: Follow up in 1 year(s) with Md/APP    Signed, Dayshia Ballinas Ninfa Meeker, PA-C  06/09/2022 12:41 PM    [facility]

## 2022-06-09 NOTE — Patient Instructions (Signed)
Medication Instructions:   Your physician recommends that you continue on your current medications as directed. Please refer to the Current Medication list given to you today.   *If you need a refill on your cardiac medications before your next appointment, please call your pharmacy*   Lab Work: None ordered  If you have labs (blood work) drawn today and your tests are completely normal, you will receive your results only by: MyChart Message (if you have MyChart) OR A paper copy in the mail If you have any lab test that is abnormal or we need to change your treatment, we will call you to review the results.   Testing/Procedures: None ordered   Follow-Up: At [facility], you and your health needs are our priority.  As part of our continuing mission to provide you with exceptional heart care, we have created designated Provider Care Teams.  These Care Teams include your primary Cardiologist (physician) and Advanced Practice Providers (APPs -  Physician Assistants and Nurse Practitioners) who all work together to provide you with the care you need, when you need it.  We recommend signing up for the patient portal called "MyChart".  Sign up information is provided on this After Visit Summary.  MyChart is used to connect with patients for Virtual Visits (Telemedicine).  Patients are able to view lab/test results, encounter notes, upcoming appointments, etc.  Non-urgent messages can be sent to your provider as well.   To learn more about what you can do with MyChart, go to [email or web address].    Your next appointment:   Your physician wants you to follow-up in: 1 year.   You will receive a reminder letter in the mail two months in advance. If you don't receive a letter, please call our office to schedule the follow-up appointment.   The format for your next appointment:   In Person  Provider:   You may see Brian Agbor-Etang, MD or one of the following Advanced Practice Providers  on your designated Care Team:   Christopher Berge, NP Ryan Dunn, PA-C Cadence Furth, PA-C   Other Instructions N/A  Important Information About Sugar       

## 2022-10-11 ENCOUNTER — Emergency Department
Admission: EM | Admit: 2022-10-11 | Discharge: 2022-10-11 | Disposition: A | Discharge status: Home / Self Care | Payer: Medicaid Other | Attending: Emergency Medicine | Admitting: Emergency Medicine

## 2022-10-11 ENCOUNTER — Other Ambulatory Visit: Payer: Self-pay

## 2022-10-11 ENCOUNTER — Emergency Department: Payer: Medicaid Other

## 2022-10-11 DIAGNOSIS — J101 Influenza due to other identified influenza virus with other respiratory manifestations: Secondary | ICD-10-CM | POA: Diagnosis not present

## 2022-10-11 DIAGNOSIS — Z20822 Contact with and (suspected) exposure to covid-19: Secondary | ICD-10-CM | POA: Diagnosis not present

## 2022-10-11 DIAGNOSIS — R0981 Nasal congestion: Secondary | ICD-10-CM | POA: Diagnosis present

## 2022-10-11 DIAGNOSIS — J111 Influenza due to unidentified influenza virus with other respiratory manifestations: Secondary | ICD-10-CM

## 2022-10-11 LAB — RESP PANEL BY RT-PCR (RSV, FLU A&B, COVID)  RVPGX2
Influenza A by PCR: POSITIVE — AB
Influenza B by PCR: NEGATIVE
Resp Syncytial Virus by PCR: NEGATIVE
SARS Coronavirus 2 by RT PCR: NEGATIVE

## 2022-10-11 MED ORDER — ALBUTEROL SULFATE HFA 108 (90 BASE) MCG/ACT IN AERS: 2.0000 | INHALATION_SPRAY | Freq: Four times a day (QID) | RESPIRATORY_TRACT | 2 refills | Status: AC | PRN

## 2022-10-11 MED ORDER — ACETAMINOPHEN 500 MG PO TABS
1000.0000 mg | ORAL_TABLET | Freq: Once | ORAL | Status: AC
  Administered 2022-10-11: 1000 mg via ORAL
  Filled 2022-10-11: qty 2

## 2022-10-11 MED ORDER — BENZONATATE 100 MG PO CAPS: 100.0000 mg | ORAL_CAPSULE | Freq: Three times a day (TID) | ORAL | 0 refills | Status: AC | PRN

## 2022-10-11 MED ORDER — IBUPROFEN 600 MG PO TABS
600.0000 mg | ORAL_TABLET | Freq: Once | ORAL | Status: AC
  Administered 2022-10-11: 600 mg via ORAL
  Filled 2022-10-11: qty 1

## 2022-10-11 MED ORDER — ONDANSETRON 4 MG PO TBDP: 4.0000 mg | ORAL_TABLET | Freq: Three times a day (TID) | ORAL | 0 refills | Status: AC | PRN

## 2022-10-11 NOTE — ED Triage Notes (Signed)
Pt arrives via POV with CC of vomiting, body aches, chills, and productive cough with thick discharge. Reports 4 episodes of emesis in last 24 hours. Denies diarrhea and sore throat.

## 2022-10-11 NOTE — ED Provider Notes (Signed)
[facility] Provider Note  Patient Contact: 10:31 PM (approximate)   History   No chief complaint on file.   HPI  Kim Craig is a 41 y.o. female  Presents to the emergency department with rhinorrhea, nasal congestion, nonproductive cough and bodyaches for the past 2 days.  No chest pain, chest tightness or shortness of breath.  Patient has been around sick contacts at work.      Physical Exam   Triage Vital Signs: ED Triage Vitals  Enc Vitals Group     BP 10/11/22 2101 131/83     Pulse Rate 10/11/22 2101 (!) 127     Resp 10/11/22 2101 (!) 22     Temp 10/11/22 2101 (!) 100.7 F (38.2 C)     Temp Source 10/11/22 2101 Oral     SpO2 10/11/22 2101 95 %     Weight 10/11/22 2117 250 lb (113.4 kg)     Height 10/11/22 2117 5\' 4"  (1.626 m)     Head Circumference --      Peak Flow --      Pain Score 10/11/22 2117 10     Pain Loc --      Pain Edu? --      Excl. in GC? --     Most recent vital signs: Vitals:   10/11/22 2101  BP: 131/83  Pulse: (!) 127  Resp: (!) 22  Temp: (!) 100.7 F (38.2 C)  SpO2: 95%     Constitutional: Alert and oriented. Patient is lying supine. Eyes: Conjunctivae are normal. PERRL. EOMI. Head: Atraumatic. ENT:      Ears: Tympanic membranes are mildly injected with mild effusion bilaterally.       Nose: No congestion/rhinnorhea.      Mouth/Throat: Mucous membranes are moist. Posterior pharynx is mildly erythematous.  Hematological/Lymphatic/Immunilogical: No cervical lymphadenopathy.  Cardiovascular: Normal rate, regular rhythm. Normal S1 and S2.  Good peripheral circulation. Respiratory: Normal respiratory effort without tachypnea or retractions. Lungs CTAB. Good air entry to the bases with no decreased or absent breath sounds. Gastrointestinal: Bowel sounds 4 quadrants. Soft and nontender to palpation. No guarding or rigidity. No palpable masses. No distention. No CVA tenderness. Musculoskeletal: Full range of  motion to all extremities. No gross deformities appreciated. Neurologic:  Normal speech and language. No gross focal neurologic deficits are appreciated.  Skin:  Skin is warm, dry and intact. No rash noted. Psychiatric: Mood and affect are normal. Speech and behavior are normal. Patient exhibits appropriate insight and judgement.   ED Results / Procedures / Treatments   Labs (all labs ordered are listed, but only abnormal results are displayed) Labs Reviewed  RESP PANEL BY RT-PCR (RSV, FLU A&B, COVID)  [id number] - Abnormal; Notable for the following components:      Result Value   Influenza A by PCR POSITIVE (*)    All other components within normal limits     EKG  EKG indicates sinus tachycardia without ST segment elevation or other apparent arrhythmia.   RADIOLOGY  I personally viewed and evaluated these images as part of my medical decision making, as well as reviewing the written report by the radiologist.  ED Provider Interpretation: Chest x-ray unremarkable.   PROCEDURES:  Critical Care performed: No  Procedures   MEDICATIONS ORDERED IN ED: Medications  acetaminophen (TYLENOL) tablet 1,000 mg (1,000 mg Oral Given 10/11/22 2112)  ibuprofen (ADVIL) tablet 600 mg (600 mg Oral Given 10/11/22 2112)     IMPRESSION / MDM /  ASSESSMENT AND PLAN / ED COURSE  I reviewed the triage vital signs and the nursing notes.                              Assessment and plan Influenza A 40 year old female presents to the emergency department with flulike symptoms.  She tested positive for influenza A.  Rest and hydration were encouraged at home.  Tylenol and ibuprofen alternating were recommended for fever.  Supportive medications were recommended at home.  Return precautions were given to return with new or worsening symptoms.     FINAL CLINICAL IMPRESSION(S) / ED DIAGNOSES   Final diagnoses:  Influenza     Rx / DC Orders   ED Discharge Orders          Ordered     benzonatate (TESSALON PERLES) 100 MG capsule  3 times daily PRN        10/11/22 2223    ondansetron (ZOFRAN-ODT) 4 MG disintegrating tablet  Every 8 hours PRN        10/11/22 2223    albuterol (VENTOLIN HFA) 108 (90 Base) MCG/ACT inhaler  Every 6 hours PRN        10/11/22 2223             Note:  This document was prepared using Dragon voice recognition software and may include unintentional dictation errors.   Pia Mau Ralls, PA-C 10/11/22 2233    Concha Se, MD 10/12/22 [phone]

## 2022-10-11 NOTE — ED Provider Triage Note (Signed)
Emergency Medicine Provider Triage Evaluation Note  Kim Craig , a 41 y.o. female  was evaluated in triage.  Pt complains of flu like symtpoms, cough, chest tightness.  Review of Systems  Positive: Fever, chills, cough, body aches, chest tightness Negative: Shob, N/V  Physical Exam  LMP 04/06/2019 (Approximate)  Gen:   Awake, no distress   Resp:  Normal effort  MSK:   Moves extremities without difficulty  Other:    Medical Decision Making  Medically screening exam initiated at 9:01 PM.  Appropriate orders placed.  Kim Craig was informed that the remainder of the evaluation will be completed by another provider, this initial triage assessment does not replace that evaluation, and the importance of remaining in the ED until their evaluation is complete.  Swab, xray, ekg   Racheal Patches, PA-C 10/11/22 2106

## 2023-12-21 ENCOUNTER — Other Ambulatory Visit: Payer: Self-pay

## 2023-12-21 DIAGNOSIS — G43809 Other migraine, not intractable, without status migrainosus: Secondary | ICD-10-CM | POA: Insufficient documentation

## 2023-12-21 DIAGNOSIS — R202 Paresthesia of skin: Secondary | ICD-10-CM | POA: Diagnosis not present

## 2023-12-21 DIAGNOSIS — R519 Headache, unspecified: Secondary | ICD-10-CM | POA: Diagnosis present

## 2023-12-21 NOTE — ED Triage Notes (Signed)
 Pt to ED via POV c/o numbness, weakness, and headache. Pt reports having a migraine for 3 days. Has taken OTC meds w/o relief. Pt reports tingling/weakness to left side that started yesterday. Pt has hx of numbness to left arm due to pinched nerve but numbness got worse. Denies any slurred speech, vision problems, faal droop. Pt A&Ox4

## 2023-12-22 ENCOUNTER — Emergency Department
Admission: EM | Admit: 2023-12-22 | Discharge: 2023-12-22 | Disposition: A | Discharge status: Home / Self Care | Attending: Emergency Medicine | Admitting: Emergency Medicine

## 2023-12-22 ENCOUNTER — Emergency Department

## 2023-12-22 DIAGNOSIS — G43809 Other migraine, not intractable, without status migrainosus: Secondary | ICD-10-CM | POA: Diagnosis not present

## 2023-12-22 DIAGNOSIS — R202 Paresthesia of skin: Secondary | ICD-10-CM

## 2023-12-22 LAB — DIFFERENTIAL
Abs Immature Granulocytes: 0.02 10*3/uL (ref 0.00–0.07)
Basophils Absolute: 0.1 10*3/uL (ref 0.0–0.1)
Basophils Relative: 1 %
Eosinophils Absolute: 0.2 10*3/uL (ref 0.0–0.5)
Eosinophils Relative: 2 %
Immature Granulocytes: 0 %
Lymphocytes Relative: 35 %
Lymphs Abs: 3.3 10*3/uL (ref 0.7–4.0)
Monocytes Absolute: 0.5 10*3/uL (ref 0.1–1.0)
Monocytes Relative: 5 %
Neutro Abs: 5.3 10*3/uL (ref 1.7–7.7)
Neutrophils Relative %: 57 %

## 2023-12-22 LAB — CBC
HCT: 41 % (ref 36.0–46.0)
Hemoglobin: 13.5 g/dL (ref 12.0–15.0)
MCH: 28 pg (ref 26.0–34.0)
MCHC: 32.9 g/dL (ref 30.0–36.0)
MCV: 85.1 fL (ref 80.0–100.0)
Platelets: 337 10*3/uL (ref 150–400)
RBC: 4.82 MIL/uL (ref 3.87–5.11)
RDW: 13.2 % (ref 11.5–15.5)
WBC: 9.3 10*3/uL (ref 4.0–10.5)
nRBC: 0 % (ref 0.0–0.2)

## 2023-12-22 LAB — TROPONIN I (HIGH SENSITIVITY): Troponin I (High Sensitivity): 3 ng/L (ref <18)

## 2023-12-22 LAB — COMPREHENSIVE METABOLIC PANEL
ALT: 21 U/L (ref 0–44)
AST: 15 U/L (ref 15–41)
Albumin: 3.7 g/dL (ref 3.5–5.0)
Alkaline Phosphatase: 70 U/L (ref 38–126)
Anion gap: 8 (ref 5–15)
BUN: 9 mg/dL (ref 6–20)
CO2: 22 mmol/L (ref 22–32)
Calcium: 9.6 mg/dL (ref 8.9–10.3)
Chloride: 105 mmol/L (ref 98–111)
Creatinine, Ser: 0.76 mg/dL (ref 0.44–1.00)
GFR, Estimated: >60 mL/min (ref >60)
Glucose, Bld: 101 mg/dL — ABNORMAL HIGH (ref 70–99)
Potassium: 3.8 mmol/L (ref 3.5–5.1)
Sodium: 135 mmol/L (ref 135–145)
Total Bilirubin: 0.4 mg/dL (ref 0.0–1.2)
Total Protein: 7.7 g/dL (ref 6.5–8.1)

## 2023-12-22 MED ORDER — DIPHENHYDRAMINE HCL 50 MG/ML IJ SOLN
25.0000 mg | Freq: Once | INTRAMUSCULAR | Status: AC
  Administered 2023-12-22: 25 mg via INTRAVENOUS
  Filled 2023-12-22: qty 1

## 2023-12-22 MED ORDER — MAGNESIUM SULFATE 2 GM/50ML IV SOLN
2.0000 g | Freq: Once | INTRAVENOUS | Status: AC
  Administered 2023-12-22: 2 g via INTRAVENOUS
  Filled 2023-12-22: qty 50

## 2023-12-22 MED ORDER — PROCHLORPERAZINE EDISYLATE 10 MG/2ML IJ SOLN
10.0000 mg | Freq: Once | INTRAMUSCULAR | Status: AC
  Administered 2023-12-22: 10 mg via INTRAVENOUS
  Filled 2023-12-22: qty 2

## 2023-12-22 MED ORDER — ACETAMINOPHEN 500 MG PO TABS
1000.0000 mg | ORAL_TABLET | Freq: Once | ORAL | Status: AC
  Administered 2023-12-22: 1000 mg via ORAL
  Filled 2023-12-22: qty 2

## 2023-12-22 MED ORDER — KETOROLAC TROMETHAMINE 30 MG/ML IJ SOLN
30.0000 mg | Freq: Once | INTRAMUSCULAR | Status: AC
  Administered 2023-12-22: 30 mg via INTRAVENOUS
  Filled 2023-12-22: qty 1

## 2023-12-22 MED ORDER — SODIUM CHLORIDE 0.9 % IV BOLUS
500.0000 mL | Freq: Once | INTRAVENOUS | Status: AC
  Administered 2023-12-22: 500 mL via INTRAVENOUS

## 2023-12-22 NOTE — Discharge Instructions (Signed)
 Thank you for choosing [place] for your health care today!  Please see your primary doctor this week for a follow up appointment.   If you have any new, worsening, or unexpected symptoms call your doctor right away or come back to the emergency department for reevaluation.  It was my pleasure to care for you today.   Daneil Dan Modesto Charon, MD

## 2023-12-22 NOTE — ED Provider Notes (Addendum)
 [facility] Provider Note    Event Date/Time   First MD Initiated Contact with Patient 12/22/23 0245     (approximate)   History   Numbness and Headache   HPI  Kim Craig is a 43 y.o. female   Past medical history of migraine headaches associated with extremity tingling, remote history of seizure, who presents to the emergency department with migraine headache over the last 3 days associate with tingling sensation in upper extremities and face, consistent with migraine in the past.  Started 3 days ago frontal wrapping around to the back of her head, started as a 4 out of 10, gradually worsened over the last 3 days.  Photosensitivity as well.  She denies visual changes.  She denies trauma.  She denies neck pain or stiffness.  No fever.  No other acute medical complaints.  Independent Historian contributed to assessment above: Her boyfriend Dimas Aguas is at bedside to corroborate information past medical history as above  External Medical Documents Reviewed: 2020 emergency department visit for complex migraine, with headache, tingling sensation, activated a stroke code with teleneurology consultation agree with complex migraine, treated in the Emergency Department and discharge.      Physical Exam   Triage Vital Signs: ED Triage Vitals  Encounter Vitals Group     BP 12/21/23 2346 (!) 138/94     Systolic BP Percentile --      Diastolic BP Percentile --      Pulse Rate 12/21/23 2346 85     Resp 12/21/23 2346 18     Temp 12/21/23 2346 97.8 F (36.6 C)     Temp Source 12/21/23 2346 Oral     SpO2 12/21/23 2346 98 %     Weight 12/21/23 2347 258 lb (117 kg)     Height 12/21/23 2347 5\' 4"  (1.626 m)     Head Circumference --      Peak Flow --      Pain Score 12/21/23 2346 10     Pain Loc --      Pain Education --      Exclude from Growth Chart --     Most recent vital signs: Vitals:   12/21/23 2346  BP: (!) 138/94  Pulse: 85  Resp: 18  Temp:  97.8 F (36.6 C)  SpO2: 98%    General: Awake, no distress.  CV:  Good peripheral perfusion.  Resp:  Normal effort.  Abd:  No distention.  Other:  Neck supple full range of motion this nontoxic well-appearing patient.  Mildly hypertensive otherwise vital signs are normal.  Extraocular movements intact and pupils equal round and reactive.  No temporal tenderness to palpation.  She has some subjective sensory changes to the left side of her face and left arm.  She notes of the left arm tingling/sensory changes are chronic from a "pinched nerve" and predated the headache.  She has no facial asymmetry or dysarthria and finger-to-nose coordination testing and gait are both normal.  Other than the after mentioned sensory changes, motor or sensory intact elsewhere.   ED Results / Procedures / Treatments   Labs (all labs ordered are listed, but only abnormal results are displayed) Labs Reviewed  COMPREHENSIVE METABOLIC PANEL - Abnormal; Notable for the following components:      Result Value   Glucose, Bld 101 (*)    All other components within normal limits  CBC  DIFFERENTIAL  PROTIME-INR  APTT  ETHANOL  TROPONIN I (HIGH SENSITIVITY)  TROPONIN I (HIGH SENSITIVITY)     I ordered and reviewed the above labs they are notable for blood glucose cell counts electrolytes unremarkable.  EKG  ED ECG REPORT I, Pilar Jarvis, the attending physician, personally viewed and interpreted this ECG.   Date: 12/22/2023  EKG Time: 2347  Rate: 79  Rhythm: sinus  Axis: nl  Intervals:none  ST&T Change: no stemi    RADIOLOGY I independently reviewed and interpreted CT of the head and see no obvious bleeding or midline shift I also reviewed radiologist's formal read.   PROCEDURES:  Critical Care performed: No  Procedures   MEDICATIONS ORDERED IN ED: Medications  ketorolac (TORADOL) 30 MG/ML injection 30 mg (has no administration in time range)  acetaminophen (TYLENOL) tablet 1,000 mg (has  no administration in time range)  prochlorperazine (COMPAZINE) injection 10 mg (has no administration in time range)  diphenhydrAMINE (BENADRYL) injection 25 mg (has no administration in time range)  magnesium sulfate IVPB 2 g 50 mL (has no administration in time range)  sodium chloride 0.9 % bolus 500 mL (has no administration in time range)     IMPRESSION / MDM / ASSESSMENT AND PLAN / ED COURSE  I reviewed the triage vital signs and the nursing notes.                                Patient's presentation is most consistent with acute presentation with potential threat to life or bodily function.  Differential diagnosis includes, but is not limited to, complex migraine, CVA, temporal arteritis, ICH   MDM:   I think this is a complex migraine that is consistent with her complex migraines experienced in the past with tingling sensations in the same area and distribution of complex migraine in the past.    Doubt stroke - She had a stroke workup and evaluation by teleneurologist for identical symptoms back in 2020 which was negative.  Her symptoms resolved with migraine cocktail.  I did not activate a stroke code as I have very low clinical suspicion for stroke, and certainly falls outside of the thrombolytic window of symptom onset 3 days ago with sensory changes greater than 1 day ago.  She has no other acute focal neurologic deficits.    No evidence of temporal arteritis given no visual changes and no temporal tenderness to palpation.    No meningismus, no fever doubt meningitis.    Will treat with migraine cocktail and reassess.  If improvement then discharge.  -- Headache and tingling have resolved.  Discharge.       FINAL CLINICAL IMPRESSION(S) / ED DIAGNOSES   Final diagnoses:  Paresthesia  Other migraine without status migrainosus, not intractable     Rx / DC Orders   ED Discharge Orders     None        Note:  This document was prepared using Dragon voice  recognition software and may include unintentional dictation errors.    Pilar Jarvis, MD 12/22/23 2956    Pilar Jarvis, MD 12/22/23 [phone]

## 2024-03-07 ENCOUNTER — Encounter: Payer: Self-pay | Admitting: Emergency Medicine

## 2024-03-07 ENCOUNTER — Other Ambulatory Visit: Payer: Self-pay

## 2024-03-07 ENCOUNTER — Emergency Department
Admission: EM | Admit: 2024-03-07 | Discharge: 2024-03-07 | Disposition: A | Discharge status: Home / Self Care | Attending: Emergency Medicine | Admitting: Emergency Medicine

## 2024-03-07 ENCOUNTER — Emergency Department

## 2024-03-07 DIAGNOSIS — R300 Dysuria: Secondary | ICD-10-CM | POA: Insufficient documentation

## 2024-03-07 DIAGNOSIS — K6289 Other specified diseases of anus and rectum: Secondary | ICD-10-CM | POA: Insufficient documentation

## 2024-03-07 DIAGNOSIS — R197 Diarrhea, unspecified: Secondary | ICD-10-CM | POA: Diagnosis present

## 2024-03-07 LAB — COMPREHENSIVE METABOLIC PANEL WITH GFR
ALT: 23 U/L (ref 0–44)
AST: 21 U/L (ref 15–41)
Albumin: 3.8 g/dL (ref 3.5–5.0)
Alkaline Phosphatase: 72 U/L (ref 38–126)
Anion gap: 9 (ref 5–15)
BUN: 10 mg/dL (ref 6–20)
CO2: 23 mmol/L (ref 22–32)
Calcium: 9.8 mg/dL (ref 8.9–10.3)
Chloride: 106 mmol/L (ref 98–111)
Creatinine, Ser: 0.74 mg/dL (ref 0.44–1.00)
GFR, Estimated: >60 mL/min (ref >60)
Glucose, Bld: 107 mg/dL — ABNORMAL HIGH (ref 70–99)
Potassium: 3.9 mmol/L (ref 3.5–5.1)
Sodium: 138 mmol/L (ref 135–145)
Total Bilirubin: 0.6 mg/dL (ref 0.0–1.2)
Total Protein: 7.7 g/dL (ref 6.5–8.1)

## 2024-03-07 LAB — URINALYSIS, ROUTINE W REFLEX MICROSCOPIC
Bacteria, UA: NONE SEEN
Bilirubin Urine: NEGATIVE
Glucose, UA: NEGATIVE mg/dL
Ketones, ur: NEGATIVE mg/dL
Leukocytes,Ua: NEGATIVE
Nitrite: NEGATIVE
Protein, ur: NEGATIVE mg/dL
RBC / HPF: 0 RBC/hpf (ref 0–5)
Specific Gravity, Urine: 1.009 (ref 1.005–1.030)
pH: 6 (ref 5.0–8.0)

## 2024-03-07 LAB — CBC
HCT: 40.6 % (ref 36.0–46.0)
Hemoglobin: 13.3 g/dL (ref 12.0–15.0)
MCH: 28.2 pg (ref 26.0–34.0)
MCHC: 32.8 g/dL (ref 30.0–36.0)
MCV: 86 fL (ref 80.0–100.0)
Platelets: 330 10*3/uL (ref 150–400)
RBC: 4.72 MIL/uL (ref 3.87–5.11)
RDW: 13.3 % (ref 11.5–15.5)
WBC: 9 10*3/uL (ref 4.0–10.5)
nRBC: 0 % (ref 0.0–0.2)

## 2024-03-07 LAB — LIPASE, BLOOD: Lipase: 24 U/L (ref 11–51)

## 2024-03-07 LAB — PREGNANCY, URINE: Preg Test, Ur: NEGATIVE

## 2024-03-07 MED ORDER — IOHEXOL 350 MG/ML SOLN
100.0000 mL | Freq: Once | INTRAVENOUS | Status: AC | PRN
  Administered 2024-03-07: 100 mL via INTRAVENOUS

## 2024-03-07 MED ORDER — OXYCODONE HCL 5 MG PO TABS: 5.0000 mg | ORAL_TABLET | Freq: Three times a day (TID) | ORAL | 0 refills | Status: AC | PRN

## 2024-03-07 MED ORDER — MORPHINE SULFATE (PF) 4 MG/ML IV SOLN
4.0000 mg | Freq: Once | INTRAVENOUS | Status: AC
  Administered 2024-03-07: 4 mg via INTRAVENOUS
  Filled 2024-03-07: qty 1

## 2024-03-07 NOTE — ED Provider Notes (Signed)
 [facility] Provider Note    Event Date/Time   First MD Initiated Contact with Patient 03/07/24 1820     (approximate)   History   Diarrhea   HPI Kim Craig is a 43 y.o. female presenting today for 7 days of diarrhea.  Patient states she was down in [facility] and had some raw seafood which she thinks caused the symptoms.  The diarrhea has been improving with only 2 episodes today.  However, over the past several days she has had worsening burning pain in her rectum.  She has tried Preparation H, topical lidocaine , and suppositories without significant benefit.  Denies any current nausea, vomiting, abdominal pain.  Is having some mild dysuria as well.  No reported blood in her stools or melena.     Physical Exam   Triage Vital Signs: ED Triage Vitals  Encounter Vitals Group     BP 03/07/24 1649 98/83     Systolic BP Percentile --      Diastolic BP Percentile --      Pulse Rate 03/07/24 1649 87     Resp 03/07/24 1649 15     Temp 03/07/24 1649 98.2 F (36.8 C)     Temp Source 03/07/24 1649 Oral     SpO2 03/07/24 1649 97 %     Weight 03/07/24 1653 243 lb (110.2 kg)     Height 03/07/24 1653 5\' 4"  (1.626 m)     Head Circumference --      Peak Flow --      Pain Score 03/07/24 1653 8     Pain Loc --      Pain Education --      Exclude from Growth Chart --     Most recent vital signs: Vitals:   03/07/24 1649 03/07/24 1653  BP: 98/83 98/83  Pulse: 87 88  Resp: 15 17  Temp: 98.2 F (36.8 C)   SpO2: 97% 98%   Physical Exam: I have reviewed the vital signs and nursing notes. General: Awake, alert, no acute distress.  Nontoxic appearing. Head:  Atraumatic, normocephalic.   ENT:  EOM intact, PERRL. Oral mucosa is pink and moist with no lesions. Neck: Neck is supple with full range of motion, No meningeal signs. Cardiovascular:  RRR, No murmurs. Peripheral pulses palpable and equal bilaterally. Respiratory:  Symmetrical chest wall expansion.   No rhonchi, rales, or wheezes.  Good air movement throughout.  No use of accessory muscles.   Musculoskeletal:  No cyanosis or edema. Moving extremities with full ROM Abdomen:  Soft, nontender, nondistended. Neuro:  GCS 15, moving all four extremities, interacting appropriately. Speech clear. Psych:  Calm, appropriate.   Skin:  Warm, dry, no rash.    ED Results / Procedures / Treatments   Labs (all labs ordered are listed, but only abnormal results are displayed) Labs Reviewed  COMPREHENSIVE METABOLIC PANEL WITH GFR - Abnormal; Notable for the following components:      Result Value   Glucose, Bld 107 (*)    All other components within normal limits  URINALYSIS, ROUTINE W REFLEX MICROSCOPIC - Abnormal; Notable for the following components:   Color, Urine STRAW (*)    APPearance CLEAR (*)    Hgb urine dipstick SMALL (*)    All other components within normal limits  LIPASE, BLOOD  CBC  PREGNANCY, URINE     EKG    RADIOLOGY    PROCEDURES:  Critical Care performed: No  Procedures   MEDICATIONS  ORDERED IN ED: Medications  morphine  (PF) 4 MG/ML injection 4 mg (4 mg Intravenous Given 03/07/24 2001)  iohexol  (OMNIPAQUE ) 350 MG/ML injection 100 mL (100 mLs Intravenous Contrast Given 03/07/24 2024)     IMPRESSION / MDM / ASSESSMENT AND PLAN / ED COURSE  I reviewed the triage vital signs and the nursing notes.                              Differential diagnosis includes, but is not limited to, anal fissure, anal irritation, external hemorrhoid, diarrheal illness  Patient's presentation is most consistent with acute complicated illness / injury requiring diagnostic workup.  Patient is a 43 year old female presenting today for diarrhea x 7 days with rectal pain.  No abdominal tenderness anywhere at this time and most likely self-limiting diarrheal illness which has improved.  However, she has significant rectal pain and has tried suppositories, Preparation H, and topical  lidocaine  without significant benefit.  Laboratory workup all reassuring at this time.  Rectal exam at the bedside shows severe tenderness to palpation around the rectum including inside.  Given severity of pain and diarrhea for 7 days, will get CT imaging to rule out internal abscess.  Patient given morphine  for pain.  CT negative for acute intra-abdominal or pelvic pathology.  Patient was noting slight improvement on reassessment.  Will plan for discharge at this time as a suspect pain is most likely related to her severe diarrheal symptoms with irritation and inflammation around the area.  Recommended follow-up with PCP as needed.  Clinical Course as of 03/07/24 2124  Wed Mar 07, 2024  1912 Bedside rectal exam shows severe tenderness palpation to the inner portions.  No obvious fissure.  Given severity of her pain, will get CT of the pelvis to rule out signs of rectal abscess [DW]  2119 CT ABDOMEN PELVIS W CONTRAST No right upper quadrant tenderness at all.  No concern for cholecystitis. [DW]    Clinical Course User Index [DW] Kandee Orion, MD     FINAL CLINICAL IMPRESSION(S) / ED DIAGNOSES   Final diagnoses:  Diarrhea of presumed infectious origin  Rectal pain     Rx / DC Orders   ED Discharge Orders          Ordered    oxyCODONE  (ROXICODONE ) 5 MG immediate release tablet  Every 8 hours PRN        03/07/24 2123             Note:  This document was prepared using Dragon voice recognition software and may include unintentional dictation errors.   Kandee Orion, MD 03/07/24 2125

## 2024-03-07 NOTE — ED Notes (Signed)
 Chaperoned rectal exam at this time with Karlynn Oyster, MD. Pt tolerated well.

## 2024-03-07 NOTE — Discharge Instructions (Signed)
 CT imaging was reassuring with no signs of abscess.  I have sent a stronger pain medicine to your pharmacy to help with symptoms as they should hopefully ease in the next couple of days.  Follow-up with your primary care provider for reassessment to ensure resolution.

## 2024-03-07 NOTE — ED Triage Notes (Signed)
 Patient to ED via POV for diarrhea. Ongoing x3 days. PT reports rectal pain. Pt reports painful to sit. States she feels like she might have a UTI as well.

## 2024-03-07 NOTE — ED Notes (Signed)
 Pt ambulatory to restroom at this time.

## 2024-03-15 ENCOUNTER — Encounter: Payer: Self-pay | Admitting: Medical

## 2024-04-04 ENCOUNTER — Other Ambulatory Visit: Payer: Self-pay | Admitting: Family Medicine

## 2024-04-04 DIAGNOSIS — Z1231 Encounter for screening mammogram for malignant neoplasm of breast: Secondary | ICD-10-CM
